# Patient Record
Sex: Female | Born: 1955 | Race: Black or African American | Hispanic: No | Marital: Single | Smoking: Former smoker
Health system: Southern US, Community
[De-identification: ages and names within clinical notes are randomized; demographics above are authoritative.]

## PROBLEM LIST (undated history)

## (undated) ENCOUNTER — Emergency Department: Discharge status: Absent without leave | Payer: Medicare Other

## (undated) DIAGNOSIS — Z972 Presence of dental prosthetic device (complete) (partial): Secondary | ICD-10-CM

## (undated) DIAGNOSIS — F50819 Binge eating disorder, unspecified: Secondary | ICD-10-CM

## (undated) DIAGNOSIS — F909 Attention-deficit hyperactivity disorder, unspecified type: Secondary | ICD-10-CM

## (undated) DIAGNOSIS — M1990 Unspecified osteoarthritis, unspecified site: Secondary | ICD-10-CM

## (undated) DIAGNOSIS — G894 Chronic pain syndrome: Secondary | ICD-10-CM

## (undated) DIAGNOSIS — F4310 Post-traumatic stress disorder, unspecified: Secondary | ICD-10-CM

## (undated) DIAGNOSIS — F5081 Binge eating disorder: Secondary | ICD-10-CM

## (undated) DIAGNOSIS — K219 Gastro-esophageal reflux disease without esophagitis: Secondary | ICD-10-CM

## (undated) DIAGNOSIS — F419 Anxiety disorder, unspecified: Secondary | ICD-10-CM

## (undated) DIAGNOSIS — R0600 Dyspnea, unspecified: Secondary | ICD-10-CM

## (undated) DIAGNOSIS — F329 Major depressive disorder, single episode, unspecified: Secondary | ICD-10-CM

## (undated) DIAGNOSIS — F32A Depression, unspecified: Secondary | ICD-10-CM

## (undated) DIAGNOSIS — S3992XA Unspecified injury of lower back, initial encounter: Secondary | ICD-10-CM

## (undated) DIAGNOSIS — E119 Type 2 diabetes mellitus without complications: Secondary | ICD-10-CM

## (undated) DIAGNOSIS — T753XXA Motion sickness, initial encounter: Secondary | ICD-10-CM

## (undated) DIAGNOSIS — J45909 Unspecified asthma, uncomplicated: Secondary | ICD-10-CM

## (undated) DIAGNOSIS — I10 Essential (primary) hypertension: Secondary | ICD-10-CM

## (undated) HISTORY — DX: Binge eating disorder, unspecified: F50.819

## (undated) HISTORY — DX: Depression, unspecified: F32.A

## (undated) HISTORY — DX: Attention-deficit hyperactivity disorder, unspecified type: F90.9

## (undated) HISTORY — DX: Chronic pain syndrome: G89.4

## (undated) HISTORY — PX: CERVICAL FUSION: SHX112

## (undated) HISTORY — DX: Binge eating disorder: F50.81

## (undated) HISTORY — DX: Major depressive disorder, single episode, unspecified: F32.9

## (undated) HISTORY — DX: Post-traumatic stress disorder, unspecified: F43.10

## (undated) HISTORY — PX: BACK SURGERY: SHX140

## (undated) HISTORY — DX: Unspecified asthma, uncomplicated: J45.909

## (undated) HISTORY — DX: Anxiety disorder, unspecified: F41.9

## (undated) HISTORY — DX: Unspecified injury of lower back, initial encounter: S39.92XA

---

## 2004-06-26 ENCOUNTER — Emergency Department: Payer: Self-pay | Admitting: Emergency Medicine

## 2007-05-28 ENCOUNTER — Ambulatory Visit: Payer: Self-pay | Admitting: Family Medicine

## 2008-03-06 DIAGNOSIS — S3992XA Unspecified injury of lower back, initial encounter: Secondary | ICD-10-CM

## 2008-03-06 HISTORY — DX: Unspecified injury of lower back, initial encounter: S39.92XA

## 2008-03-28 ENCOUNTER — Emergency Department: Payer: Self-pay | Admitting: Emergency Medicine

## 2009-03-30 ENCOUNTER — Emergency Department: Payer: Self-pay | Admitting: Emergency Medicine

## 2010-09-14 ENCOUNTER — Emergency Department: Payer: Self-pay | Admitting: Emergency Medicine

## 2010-11-10 ENCOUNTER — Emergency Department: Payer: Self-pay | Admitting: Emergency Medicine

## 2011-09-19 ENCOUNTER — Ambulatory Visit: Payer: Self-pay | Admitting: Internal Medicine

## 2012-02-09 ENCOUNTER — Ambulatory Visit: Payer: Self-pay | Admitting: Emergency Medicine

## 2012-04-23 ENCOUNTER — Ambulatory Visit: Payer: Self-pay | Admitting: Family Medicine

## 2013-11-28 ENCOUNTER — Ambulatory Visit: Payer: Self-pay

## 2015-05-08 ENCOUNTER — Encounter: Payer: Self-pay | Admitting: Emergency Medicine

## 2015-05-08 ENCOUNTER — Emergency Department
Admission: EM | Admit: 2015-05-08 | Discharge: 2015-05-08 | Disposition: A | Discharge status: Home / Self Care | Payer: Self-pay | Attending: Student | Admitting: Student

## 2015-05-08 DIAGNOSIS — I10 Essential (primary) hypertension: Secondary | ICD-10-CM | POA: Insufficient documentation

## 2015-05-08 DIAGNOSIS — E119 Type 2 diabetes mellitus without complications: Secondary | ICD-10-CM | POA: Insufficient documentation

## 2015-05-08 DIAGNOSIS — H9202 Otalgia, left ear: Secondary | ICD-10-CM | POA: Insufficient documentation

## 2015-05-08 DIAGNOSIS — Z87891 Personal history of nicotine dependence: Secondary | ICD-10-CM | POA: Insufficient documentation

## 2015-05-08 HISTORY — DX: Essential (primary) hypertension: I10

## 2015-05-08 HISTORY — DX: Type 2 diabetes mellitus without complications: E11.9

## 2015-05-08 MED ORDER — OFLOXACIN 0.3 % OP SOLN: 1.0000 [drp] | Freq: Four times a day (QID) | OPHTHALMIC | Status: DC

## 2015-05-08 NOTE — ED Notes (Signed)
Discussed discharge instructions, prescriptions, and follow-up care with patient. No questions or concerns at this time. Pt stable at discharge.  

## 2015-05-08 NOTE — ED Notes (Signed)
Intermittent ear discomfort x 2 weeks. States heard fluttering at onset and thinks might have gotten something in it.

## 2015-05-08 NOTE — Discharge Instructions (Signed)
Ear Drops, Adult °You have been diagnosed with a condition requiring you to put drops of medicine into your outer ear. °HOME CARE INSTRUCTIONS  °· Put drops in the affected ear as instructed. After putting the drops in, you will need to lie down with the affected ear facing up for ten minutes so the drops will remain in the ear canal and run down and fill the canal. Continue using the ear drops for as long as directed by your health care provider. °· Prior to getting up, put a cotton ball gently in your ear canal. Leave enough of the cotton ball out so it can be easily removed. Do not attempt to push this down into the canal with a cotton-tipped swab or other instrument. °· Do not irrigate or wash out your ears if you have had a perforated eardrum or mastoid surgery, or unless instructed to do so by your health care provider. °· Keep appointments with your health care provider as instructed. °· Finish all medicine, or use for the length of time prescribed by your health care provider. Continue the drops even if your problem seems to be doing well after a couple days, or continue as instructed. °SEEK MEDICAL CARE IF: °· You become worse or develop increasing pain. °· You notice any unusual drainage from your ear (particularly if the drainage has a bad smell). °· You develop hearing difficulties. °· You experience a serious form of dizziness in which you feel as if the room is spinning, and you feel nauseated (vertigo). °· The outside of your ear becomes red or swollen or both. This may be a sign of an allergic reaction. °MAKE SURE YOU:  °· Understand these instructions. °· Will watch your condition. °· Will get help right away if you are not doing well or get worse. °  °This information is not intended to replace advice given to you by your health care provider. Make sure you discuss any questions you have with your health care provider. °  °Document Released: 02/14/2001 Document Revised: 03/13/2014 Document  Reviewed: 09/17/2012 °Elsevier Interactive Patient Education ©2016 [employer] ° °Earache °An earache, also called otalgia, can be caused by many things. Pain from an earache can be sharp, dull, or burning. The pain may be temporary or constant. °Earaches can be caused by problems with the ear, such as infection in either the middle ear or the ear canal, injury, impacted ear wax, middle ear pressure, or a foreign body in the ear. Ear pain can also result from problems in other areas. This is called referred pain. For example, pain can come from a sore throat, a tooth infection, or problems with the jaw or the joint between the jaw and the skull (temporomandibular joint, or TMJ). °The cause of an earache is not always easy to identify. Watchful waiting may be appropriate for some earaches until a clear cause of the pain can be found. °HOME CARE INSTRUCTIONS °Watch your condition for any changes. The following actions may help to lessen any discomfort that you are feeling: °· Take medicines only as directed by your health care provider. This includes ear drops. °· Apply ice to your outer ear to help reduce pain. °¨ Put ice in a plastic bag. °¨ Place a towel between your skin and the bag. °¨ Leave the ice on for 20 minutes, 2-3 times per day. °· Do not put anything in your ear other than medicine that is prescribed by your health care provider. °· Try resting   in an upright position instead of lying down. This may help to reduce pressure in the middle ear and relieve pain. °· Chew gum if it helps to relieve your ear pain. °· Control any allergies that you have. °· Keep all follow-up visits as directed by your health care provider. This is important. °SEEK MEDICAL CARE IF: °· Your pain does not improve within 2 days. °· You have a fever. °· You have new or worsening symptoms. °SEEK IMMEDIATE MEDICAL CARE IF: °· You have a severe headache. °· You have a stiff neck. °· You have difficulty swallowing. °· You have redness  or swelling behind your ear. °· You have drainage from your ear. °· You have hearing loss. °· You feel dizzy. °  °This information is not intended to replace advice given to you by your health care provider. Make sure you discuss any questions you have with your health care provider. °  °Document Released: 10/08/2003 Document Revised: 03/13/2014 Document Reviewed: 09/21/2013 °Elsevier Interactive Patient Education ©2016 [employer] ° °

## 2015-05-08 NOTE — ED Provider Notes (Signed)
[facility] Emergency Department Provider Note  ____________________________________________  Time seen: Approximately 8:08 AM  I have reviewed the triage vital signs and the nursing notes.   HISTORY  Chief Complaint Otalgia    HPI Maria Conway is a 60 y.o. female presents for evaluation of intermittent ear discomfort for 2 weeks on the left ear. Patient states that she feels like something may have gotten it is not sure. Has been using Q-tips and peroxide with no relief. Eyes any fever chills vertical or off balance. Past medical history significant for hypertension and diabetes and blood pressure was noted and discussed. Patient rates her pain presently is an 8/10. Denies any drainage or discharge.   Past Medical History  Diagnosis Date  . Diabetes mellitus without complication (HCC)   . Hypertension     There are no active problems to display for this patient.   No past surgical history on file.  No current outpatient prescriptions on file.  Allergies Lisinopril  No family history on file.  Social History Social History  Substance Use Topics  . Smoking status: Former Games developer  . Smokeless tobacco: None  . Alcohol Use: No    Review of Systems Constitutional: No fever/chills no dizziness ENT: No sore throat. Positive for left ear discomfort Cardiovascular: Denies chest pain. Respiratory: Denies shortness of breath. Musculoskeletal: Negative for back pain. Skin: Negative for rash. Neurological: Negative for headaches, focal weakness or numbness.  10-point ROS otherwise negative.  ____________________________________________   PHYSICAL EXAM:  VITAL SIGNS: ED Triage Vitals  Enc Vitals Group     BP 05/08/15 0753 166/100 mmHg     Pulse Rate 05/08/15 0753 98     Resp 05/08/15 0753 20     Temp 05/08/15 0753 98.1 F (36.7 C)     Temp Source 05/08/15 0753 Oral     SpO2 05/08/15 0753 98 %     Weight 05/08/15 0753 250 lb (113.399  kg)     Height 05/08/15 0753  (1.676 m)     Head Cir --      Peak Flow --      Pain Score 05/08/15 0757 8     Pain Loc --      Pain Edu? --      Excl. in GC? --     Constitutional: Alert and oriented. Well appearing and in no acute distress. Head: Atraumatic. Right TM unremarkable left TM with some dulling of the membrane and acute redness and erythema to the canal itself. No perforations or foreign body noted Nose: No congestion/rhinnorhea. Mouth/Throat: Mucous membranes are moist.  Oropharynx non-erythematous. Neck: No stridor.   Cardiovascular: Normal rate, regular rhythm. Grossly normal heart sounds.  Good peripheral circulation. Respiratory: Normal respiratory effort.  No retractions. Lungs CTAB. Neurologic:  Normal speech and language. No gross focal neurologic deficits are appreciated. No gait instability. Skin:  Skin is warm, dry and intact. No rash noted. Psychiatric: Mood and affect are normal. Speech and behavior are normal.  ____________________________________________   LABS (all labs ordered are listed, but only abnormal results are displayed)  Labs Reviewed - No data to display    PROCEDURES  Procedure(s) performed: None  Critical Care performed: No  ____________________________________________   INITIAL IMPRESSION / ASSESSMENT AND PLAN / ED COURSE  Pertinent labs & imaging results that were available during my care of the patient were reviewed by me and considered in my medical decision making (see chart for details).  Acute otitis externa. Rx  given for Ocuflox drops twice a day to left ear. Patient follow-up with her PCP which she has scheduled next week for recheck and monitoring of her blood pressure. He voices no other complaints at this time. ____________________________________________   FINAL CLINICAL IMPRESSION(S) / ED DIAGNOSES  Final diagnoses:  None     This chart was dictated using voice recognition software/Dragon. Despite best  efforts to proofread, errors can occur which can change the meaning. Any change was purely unintentional.   Evangeline Dakinharles M Issacc Merlo, PA-C 05/08/15 [phone]  Gayla DossEryka A Gayle, MD 05/08/15 [phone]

## 2015-05-14 ENCOUNTER — Ambulatory Visit: Payer: Self-pay

## 2015-09-16 ENCOUNTER — Encounter: Payer: Self-pay | Admitting: Pharmacist

## 2016-02-09 DIAGNOSIS — R1900 Intra-abdominal and pelvic swelling, mass and lump, unspecified site: Secondary | ICD-10-CM | POA: Insufficient documentation

## 2016-02-25 DIAGNOSIS — R928 Other abnormal and inconclusive findings on diagnostic imaging of breast: Secondary | ICD-10-CM | POA: Insufficient documentation

## 2016-06-20 ENCOUNTER — Emergency Department: Payer: Medicaid Other

## 2016-06-20 ENCOUNTER — Inpatient Hospital Stay
Admission: EM | Admit: 2016-06-20 | Discharge: 2016-06-23 | DRG: 446 | Disposition: A | Discharge status: Home / Self Care | Payer: Medicaid Other | Attending: Surgery | Admitting: Surgery

## 2016-06-20 ENCOUNTER — Encounter: Payer: Self-pay | Admitting: *Deleted

## 2016-06-20 DIAGNOSIS — Z7951 Long term (current) use of inhaled steroids: Secondary | ICD-10-CM

## 2016-06-20 DIAGNOSIS — Z6839 Body mass index (BMI) 39.0-39.9, adult: Secondary | ICD-10-CM

## 2016-06-20 DIAGNOSIS — E1143 Type 2 diabetes mellitus with diabetic autonomic (poly)neuropathy: Secondary | ICD-10-CM | POA: Diagnosis present

## 2016-06-20 DIAGNOSIS — I10 Essential (primary) hypertension: Secondary | ICD-10-CM | POA: Diagnosis present

## 2016-06-20 DIAGNOSIS — K5900 Constipation, unspecified: Secondary | ICD-10-CM | POA: Diagnosis present

## 2016-06-20 DIAGNOSIS — R112 Nausea with vomiting, unspecified: Secondary | ICD-10-CM

## 2016-06-20 DIAGNOSIS — R1011 Right upper quadrant pain: Secondary | ICD-10-CM

## 2016-06-20 DIAGNOSIS — Z79899 Other long term (current) drug therapy: Secondary | ICD-10-CM

## 2016-06-20 DIAGNOSIS — M549 Dorsalgia, unspecified: Secondary | ICD-10-CM | POA: Diagnosis present

## 2016-06-20 DIAGNOSIS — Z87891 Personal history of nicotine dependence: Secondary | ICD-10-CM

## 2016-06-20 DIAGNOSIS — K3184 Gastroparesis: Secondary | ICD-10-CM | POA: Diagnosis present

## 2016-06-20 DIAGNOSIS — Z794 Long term (current) use of insulin: Secondary | ICD-10-CM | POA: Diagnosis not present

## 2016-06-20 DIAGNOSIS — Z888 Allergy status to other drugs, medicaments and biological substances status: Secondary | ICD-10-CM

## 2016-06-20 DIAGNOSIS — R1084 Generalized abdominal pain: Secondary | ICD-10-CM

## 2016-06-20 DIAGNOSIS — K8000 Calculus of gallbladder with acute cholecystitis without obstruction: Secondary | ICD-10-CM | POA: Diagnosis not present

## 2016-06-20 DIAGNOSIS — G8929 Other chronic pain: Secondary | ICD-10-CM | POA: Diagnosis present

## 2016-06-20 DIAGNOSIS — R5381 Other malaise: Secondary | ICD-10-CM | POA: Diagnosis present

## 2016-06-20 DIAGNOSIS — B373 Candidiasis of vulva and vagina: Secondary | ICD-10-CM | POA: Diagnosis present

## 2016-06-20 DIAGNOSIS — K819 Cholecystitis, unspecified: Secondary | ICD-10-CM | POA: Diagnosis present

## 2016-06-20 DIAGNOSIS — K810 Acute cholecystitis: Secondary | ICD-10-CM

## 2016-06-20 DIAGNOSIS — E669 Obesity, unspecified: Secondary | ICD-10-CM | POA: Diagnosis present

## 2016-06-20 DIAGNOSIS — J45909 Unspecified asthma, uncomplicated: Secondary | ICD-10-CM | POA: Diagnosis present

## 2016-06-20 LAB — GLUCOSE, CAPILLARY
GLUCOSE-CAPILLARY: 173 mg/dL — AB (ref 65–99)
Glucose-Capillary: 124 mg/dL — ABNORMAL HIGH (ref 65–99)
Glucose-Capillary: 185 mg/dL — ABNORMAL HIGH (ref 65–99)

## 2016-06-20 LAB — HEPATIC FUNCTION PANEL
ALK PHOS: 103 U/L (ref 38–126)
ALT: 20 U/L (ref 14–54)
AST: 22 U/L (ref 15–41)
Albumin: 3.8 g/dL (ref 3.5–5.0)
BILIRUBIN TOTAL: 0.6 mg/dL (ref 0.3–1.2)
Total Protein: 7.4 g/dL (ref 6.5–8.1)

## 2016-06-20 LAB — LIPASE, BLOOD: LIPASE: 46 U/L (ref 11–51)

## 2016-06-20 LAB — BASIC METABOLIC PANEL
ANION GAP: 9 (ref 5–15)
BUN: 10 mg/dL (ref 6–20)
CO2: 27 mmol/L (ref 22–32)
CREATININE: 1.02 mg/dL — AB (ref 0.44–1.00)
Calcium: 9.7 mg/dL (ref 8.9–10.3)
Chloride: 100 mmol/L — ABNORMAL LOW (ref 101–111)
GFR calc Af Amer: >60 mL/min (ref >60)
GFR, EST NON AFRICAN AMERICAN: 59 mL/min — AB (ref >60)
GLUCOSE: 129 mg/dL — AB (ref 65–99)
Potassium: 3.5 mmol/L (ref 3.5–5.1)
Sodium: 136 mmol/L (ref 135–145)

## 2016-06-20 LAB — TROPONIN I: Troponin I: <0.03 ng/mL (ref <0.03)

## 2016-06-20 LAB — CBC
HCT: 37.6 % (ref 35.0–47.0)
Hemoglobin: 12.7 g/dL (ref 12.0–16.0)
MCH: 27.1 pg (ref 26.0–34.0)
MCHC: 33.8 g/dL (ref 32.0–36.0)
MCV: 80.1 fL (ref 80.0–100.0)
PLATELETS: 255 10*3/uL (ref 150–440)
RBC: 4.69 MIL/uL (ref 3.80–5.20)
RDW: 15.1 % — AB (ref 11.5–14.5)
WBC: 9.9 10*3/uL (ref 3.6–11.0)

## 2016-06-20 MED ORDER — KETOROLAC TROMETHAMINE 30 MG/ML IJ SOLN
30.0000 mg | Freq: Four times a day (QID) | INTRAMUSCULAR | Status: DC
  Administered 2016-06-20 – 2016-06-23 (×13): 30 mg via INTRAVENOUS
  Filled 2016-06-20 (×13): qty 1

## 2016-06-20 MED ORDER — HALOPERIDOL LACTATE 5 MG/ML IJ SOLN: 2.0000 mg | Freq: Once | INTRAMUSCULAR | Status: DC

## 2016-06-20 MED ORDER — PANTOPRAZOLE SODIUM 40 MG IV SOLR
40.0000 mg | Freq: Every day | INTRAVENOUS | Status: DC
  Administered 2016-06-20 – 2016-06-22 (×3): 40 mg via INTRAVENOUS
  Filled 2016-06-20 (×3): qty 40

## 2016-06-20 MED ORDER — LACTATED RINGERS IV SOLN
125.0000 mL/h | INTRAVENOUS | Status: DC
  Administered 2016-06-20 (×2): 125 mL/h via INTRAVENOUS

## 2016-06-20 MED ORDER — HYDROMORPHONE HCL 1 MG/ML IJ SOLN
0.5000 mg | INTRAMUSCULAR | Status: DC | PRN
  Administered 2016-06-20: 0.5 mg via INTRAVENOUS
  Filled 2016-06-20: qty 1

## 2016-06-20 MED ORDER — IOPAMIDOL (ISOVUE-300) INJECTION 61%
30.0000 mL | Freq: Once | INTRAVENOUS | Status: AC | PRN
  Administered 2016-06-20: 30 mL via ORAL

## 2016-06-20 MED ORDER — ALBUTEROL SULFATE (2.5 MG/3ML) 0.083% IN NEBU: 2.5000 mg | INHALATION_SOLUTION | RESPIRATORY_TRACT | Status: DC | PRN

## 2016-06-20 MED ORDER — AMLODIPINE BESYLATE 5 MG PO TABS
10.0000 mg | ORAL_TABLET | Freq: Every day | ORAL | Status: DC
  Administered 2016-06-20 – 2016-06-23 (×4): 10 mg via ORAL
  Filled 2016-06-20 (×5): qty 2

## 2016-06-20 MED ORDER — ONDANSETRON HCL 4 MG/2ML IJ SOLN
4.0000 mg | Freq: Four times a day (QID) | INTRAMUSCULAR | Status: DC | PRN
  Administered 2016-06-21 – 2016-06-23 (×2): 4 mg via INTRAVENOUS
  Filled 2016-06-20 (×2): qty 2

## 2016-06-20 MED ORDER — PIPERACILLIN-TAZOBACTAM 3.375 G IVPB 30 MIN
3.3750 g | Freq: Once | INTRAVENOUS | Status: AC
  Administered 2016-06-20: 3.375 g via INTRAVENOUS
  Filled 2016-06-20: qty 50

## 2016-06-20 MED ORDER — ONDANSETRON 4 MG PO TBDP: 4.0000 mg | ORAL_TABLET | Freq: Four times a day (QID) | ORAL | Status: DC | PRN

## 2016-06-20 MED ORDER — ENOXAPARIN SODIUM 40 MG/0.4ML ~~LOC~~ SOLN
40.0000 mg | SUBCUTANEOUS | Status: DC
  Administered 2016-06-20 – 2016-06-22 (×3): 40 mg via SUBCUTANEOUS
  Filled 2016-06-20 (×3): qty 0.4

## 2016-06-20 MED ORDER — HYDROCHLOROTHIAZIDE 25 MG PO TABS
25.0000 mg | ORAL_TABLET | Freq: Every day | ORAL | Status: DC
  Administered 2016-06-20 – 2016-06-23 (×4): 25 mg via ORAL
  Filled 2016-06-20 (×4): qty 1

## 2016-06-20 MED ORDER — INSULIN ASPART 100 UNIT/ML ~~LOC~~ SOLN
0.0000 [IU] | SUBCUTANEOUS | Status: DC
  Administered 2016-06-20 (×2): 4 [IU] via SUBCUTANEOUS
  Administered 2016-06-21 (×2): 3 [IU] via SUBCUTANEOUS
  Administered 2016-06-22 (×2): 11 [IU] via SUBCUTANEOUS
  Filled 2016-06-20 (×2): qty 3
  Filled 2016-06-20 (×2): qty 11
  Filled 2016-06-20 (×2): qty 4

## 2016-06-20 MED ORDER — PROMETHAZINE HCL 25 MG/ML IJ SOLN
12.5000 mg | Freq: Four times a day (QID) | INTRAMUSCULAR | Status: DC | PRN
  Administered 2016-06-20: 12.5 mg via INTRAVENOUS
  Filled 2016-06-20: qty 1

## 2016-06-20 MED ORDER — BUDESONIDE 0.25 MG/2ML IN SUSP
0.2500 mg | Freq: Two times a day (BID) | RESPIRATORY_TRACT | Status: DC
  Administered 2016-06-20 – 2016-06-23 (×6): 0.25 mg via RESPIRATORY_TRACT
  Filled 2016-06-20 (×6): qty 2

## 2016-06-20 MED ORDER — PIPERACILLIN-TAZOBACTAM 3.375 G IVPB
3.3750 g | Freq: Three times a day (TID) | INTRAVENOUS | Status: DC
  Administered 2016-06-20 – 2016-06-22 (×6): 3.375 g via INTRAVENOUS
  Filled 2016-06-20 (×8): qty 50

## 2016-06-20 MED ORDER — IOPAMIDOL (ISOVUE-300) INJECTION 61%
100.0000 mL | Freq: Once | INTRAVENOUS | Status: AC | PRN
  Administered 2016-06-20: 100 mL via INTRAVENOUS

## 2016-06-20 MED ORDER — BECLOMETHASONE DIPROPIONATE 40 MCG/ACT IN AERS: 2.0000 | INHALATION_SPRAY | Freq: Two times a day (BID) | RESPIRATORY_TRACT | Status: DC

## 2016-06-20 MED ORDER — SODIUM CHLORIDE 0.9 % IV BOLUS (SEPSIS)
1000.0000 mL | Freq: Once | INTRAVENOUS | Status: AC
  Administered 2016-06-20: 1000 mL via INTRAVENOUS

## 2016-06-20 MED ORDER — MORPHINE SULFATE (PF) 4 MG/ML IV SOLN
4.0000 mg | INTRAVENOUS | Status: DC | PRN
  Administered 2016-06-20 – 2016-06-23 (×10): 4 mg via INTRAVENOUS
  Filled 2016-06-20 (×10): qty 1

## 2016-06-20 MED ORDER — LORAZEPAM 2 MG/ML IJ SOLN
1.0000 mg | Freq: Once | INTRAMUSCULAR | Status: AC
  Administered 2016-06-20: 1 mg via INTRAVENOUS
  Filled 2016-06-20: qty 1

## 2016-06-20 MED ORDER — GI COCKTAIL ~~LOC~~
30.0000 mL | Freq: Once | ORAL | Status: AC
  Administered 2016-06-20: 30 mL via ORAL
  Filled 2016-06-20: qty 30

## 2016-06-20 MED ORDER — ALBUTEROL SULFATE HFA 108 (90 BASE) MCG/ACT IN AERS: 2.0000 | INHALATION_SPRAY | RESPIRATORY_TRACT | Status: DC | PRN

## 2016-06-20 NOTE — Progress Notes (Signed)
Pharmacy Antibiotic Note  Maria Conway is a 61 y.o. female admitted on 06/20/2016 with intra-abdominal infection.  Pharmacy has been consulted for piperacillin/tazobactam dosing.  Plan: Piperacillin/tazobactam 3.375 g IV q8h EI  Height:  (167.6 cm) Weight: 247 lb (112 kg) IBW/kg (Calculated) : 59.3  Temp (24hrs), Avg:98 F (36.7 C), Min:98 F (36.7 C), Max:98 F (36.7 C)   Recent Labs Lab 06/20/16 0052  WBC 9.9  CREATININE 1.02*    Estimated Creatinine Clearance: 74.4 mL/min (A) (by C-G formula based on SCr of 1.02 mg/dL (H)).    Allergies  Allergen Reactions  . Aspartame And Phenylalanine Other (See Comments) and Cough    Reaction: throat irritation  . Lisinopril Cough   Thank you for allowing pharmacy to be a part of this patient's care.  Cindi Carbon, PharmD Clinical Pharmacist 06/20/2016 1:38 PM

## 2016-06-20 NOTE — ED Notes (Signed)
Up to toilet to void. 

## 2016-06-20 NOTE — ED Provider Notes (Signed)
Patient received in sign-out from Dr. Zenda Alpers.  Workup and evaluation pending CT imaging. CT imaging does show a distended gallbladder with concerning for possible cholecystitis. Will further characterize with right upper quadrant ultrasound. Patient otherwise is afebrile and hemodynamically stable.   ----------------------------------------- 11:18 AM on 06/20/2016 -----------------------------------------  Ultrasound does show evidence of cholelithiasis with evidence of acute cholecystitis. Patient remains hemodynamically stable but does have persistent pain in the right upper quadrant. I did speak with Dr. Sabino Niemann regarding the patient's findings and he agrees to consult the patient.  Have discussed with the patient and available family all diagnostics and treatments performed thus far and all questions were answered to the best of my ability. The patient demonstrates understanding and agreement with plan.    Willy Eddy, MD 06/20/16 1118

## 2016-06-20 NOTE — ED Notes (Signed)
Pt oxygen sat 88% RA.  Patient encouraged to take deep breaths.  Patient placed on 2L [place] at this time.

## 2016-06-20 NOTE — ED Notes (Signed)
Awaiting US results.
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
## 2016-06-20 NOTE — ED Provider Notes (Signed)
[facility] Emergency Department Provider Note   ____________________________________________   First MD Initiated Contact with Patient 06/20/16 [phone]     (approximate)  I have reviewed the triage vital signs and the nursing notes.   HISTORY  Chief Complaint Abdominal Pain; Chest Pain; and Constipation    HPI Maria Conway is a 61 y.o. female who comes into the hospital today with severe abdominal pain and bloating. She reports that her stomach is rising and it seems to block her breathing so that she cannot breathe. She reports that she cannot go to the bathroom and passed gas. She has a history of chronic pain due to back pain but this belly pain has been going on for about 3 days. The patient states that she has been vomiting and it looks like foam. She states that the pain is all over her abdomen but mostly on the right side. She rates her pain a 10 out of 10 in intensity. She's had some chills with no fever and has been urinating well. The patient has had similar symptoms to this in the past and has been told she has gastroparesis. A month ago she was in [place] with similar symptoms but she reports it was more severe then. The patient reports that she is unable to tolerate the pain so she decided to come into the hospital today for further evaluation of the symptoms.   Past Medical History:  Diagnosis Date  . Diabetes mellitus without complication (HCC)   . Hypertension     There are no active problems to display for this patient.   History reviewed. No pertinent surgical history.  Prior to Admission medications   Medication Sig Start Date End Date Taking? Authorizing Provider  ofloxacin (OCUFLOX) 0.3 % ophthalmic solution Place 1 drop into the left ear 4 (four) times daily. 05/08/15   Evangeline Dakin, PA-C    Allergies Lisinopril  No family history on file.  Social History Social History  Substance Use Topics  . Smoking status: Former  Games developer  . Smokeless tobacco: Not on file  . Alcohol use No    Review of Systems Constitutional: No fever/chills Eyes: No visual changes. ENT: No sore throat. Cardiovascular: Denies chest pain. Respiratory: Denies shortness of breath. Gastrointestinal:  abdominal pain, nausea, vomiting.  No diarrhea.  No constipation. Genitourinary: Negative for dysuria. Musculoskeletal: Negative for back pain. Skin: Negative for rash. Neurological: Negative for headaches, focal weakness or numbness.  10-point ROS otherwise negative.  ____________________________________________   PHYSICAL EXAM:  VITAL SIGNS: ED Triage Vitals  Enc Vitals Group     BP 06/20/16 0054 (!) 146/90     Pulse Rate 06/20/16 0054 89     Resp 06/20/16 0054 20     Temp 06/20/16 0054 98 F (36.7 C)     Temp Source 06/20/16 0054 Oral     SpO2 06/20/16 0054 96 %     Weight 06/20/16 0048 247 lb (112 kg)     Height 06/20/16 0048  (1.676 m)     Head Circumference --      Peak Flow --      Pain Score 06/20/16 0048 10     Pain Loc --      Pain Edu? --      Excl. in GC? --     Constitutional: Alert and oriented. Well appearing and in moderate to severe distress. Eyes: Conjunctivae are normal. PERRL. EOMI. Head: Atraumatic. Nose: No congestion/rhinnorhea. Mouth/Throat: Mucous membranes  are moist.  Oropharynx non-erythematous. Cardiovascular: Normal rate, regular rhythm. Grossly normal heart sounds.  Good peripheral circulation. Respiratory: Normal respiratory effort.  No retractions. Lungs CTAB. Gastrointestinal: Soft with diffuse abd tenderness to palpation. No distention. Positive bowel sounds Musculoskeletal: No lower extremity tenderness nor edema.   Neurologic:  Normal speech and language.  Skin:  Skin is warm, dry and intact.  Psychiatric: Mood and affect are normal.   ____________________________________________   LABS (all labs ordered are listed, but only abnormal results are displayed)  Labs  Reviewed  BASIC METABOLIC PANEL - Abnormal; Notable for the following:       Result Value   Chloride 100 (*)    Glucose, Bld 129 (*)    Creatinine, Ser 1.02 (*)    GFR calc non Af Amer 59 (*)    All other components within normal limits  CBC - Abnormal; Notable for the following:    RDW 15.1 (*)    All other components within normal limits  TROPONIN I  LIPASE, BLOOD  TROPONIN I   ____________________________________________  EKG  ED ECG REPORT I, Rebecka Apley, the attending physician, personally viewed and interpreted this ECG.   Date: 06/20/2016  EKG Time: 0049  Rate: 83  Rhythm: normal sinus rhythm  Axis: normal  Intervals:none  ST&T Change: none  ____________________________________________  RADIOLOGY  KUB CXR CT abd and pelvis  ____________________________________________   PROCEDURES  Procedure(s) performed: None  Procedures  Critical Care performed: No  ____________________________________________   INITIAL IMPRESSION / ASSESSMENT AND PLAN / ED COURSE  Pertinent labs & imaging results that were available during my care of the patient were reviewed by me and considered in my medical decision making (see chart for details).  This is a 61 year old female who comes into the hospital today with some abdominal pain. She is also had some nausea and vomiting. The patient reports that she has been diagnosed with gastroparesis in the past but she states that her abdomen is distended. I did check the patient's blood work including 2 troponins which were unremarkable. The patient's x-rays are unremarkable but given her level of pain I will perform a CT scan. I will give the patient liter of normal saline and a GI cocktail with some Ativan. I will reassess the patient.  Clinical Course as of Jun 20 801  Tue Jun 20, 2016  0446 Normal nonobstructive bowel gas pattern. DG Abdomen 1 View [AW]  0446 No evidence of active pulmonary disease. Linear fibrosis  or atelectasis in the left lung base.   DG Chest 2 View [AW]    Clinical Course User Index [AW] Rebecka Apley, MD    The patient's care will be signed out to Dr. Roxan Hockey who will follow-up the results of the CT scan and disposition the patient. ____________________________________________   FINAL CLINICAL IMPRESSION(S) / ED DIAGNOSES  Final diagnoses:  Generalized abdominal pain  Non-intractable vomiting with nausea, unspecified vomiting type      NEW MEDICATIONS STARTED DURING THIS VISIT:  New Prescriptions   No medications on file     Note:  This document was prepared using Dragon voice recognition software and may include unintentional dictation errors.    Rebecka Apley, MD 06/20/16 [phone]

## 2016-06-20 NOTE — H&P (Signed)
Date of Admission:  06/20/2016  Reason for Admission:  Acute cholecystitis  History of Present Illness: Maria Conway is a 61 y.o. female who presents with a 5 day history of abdominal pain.  She reports an episode of abdominal pain about a month ago in [place] and was hospitalized for a few days, and she was told she had gastroparesis.  Per her report, there was no mention of gallbladder issues.    This episode started on 4/13 with some vague abdominal pain in the upper abdomen.  The patient was unsure what it was and had tried home remedies over the weekend with only temporary improvement in her symptoms.  However, on 4/15, her pain started worsening and has become severe causing nausea and vomiting.  She feels bloated as well.  Her pain is mostly in the epigastric and right upper quadrant.  Denies any fevers at home, chills, chest pain, shortness of breath.  Does report that with the pain it hurts to take a deep breath.  Denies any constipation or diarrhea, dysuria or hematuria.  Past Medical History: Past Medical History:  Diagnosis Date  . Diabetes mellitus without complication (HCC)   . Hypertension Chronic back pain Asthma Gastroparesis      Past Surgical History: --C section  Home Medications: Prior to Admission medications   Medication Sig Start Date End Date Taking? Authorizing Provider  albuterol (PROVENTIL HFA;VENTOLIN HFA) 108 (90 Base) MCG/ACT inhaler Inhale 2 puffs into the lungs every 4 (four) hours as needed for wheezing or shortness of breath.   Yes Historical Provider, MD  amLODipine (NORVASC) 10 MG tablet Take 10 mg by mouth daily.   Yes Historical Provider, MD  beclomethasone (QVAR) 40 MCG/ACT inhaler Inhale 2 puffs into the lungs 2 (two) times daily.   Yes Historical Provider, MD  exenatide (BYETTA) 5 MCG/0.02ML SOPN injection Inject 5 mcg into the skin 2 (two) times daily.   Yes Historical Provider, MD  gabapentin (NEURONTIN) 600 MG tablet Take 600 mg by mouth 4  (four) times daily.   Yes Historical Provider, MD  hydrochlorothiazide (HYDRODIURIL) 25 MG tablet Take 25 mg by mouth daily.   Yes Historical Provider, MD  insulin regular (NOVOLIN R,HUMULIN R) 250 units/2.80mL (100 units/mL) injection Inject 2-10 Units into the skin 3 (three) times daily before meals. Per sliding scale   Yes Historical Provider, MD  lisdexamfetamine (VYVANSE) 30 MG capsule Take 30 mg by mouth daily.   Yes Historical Provider, MD  metFORMIN (GLUCOPHAGE-XR) 500 MG 24 hr tablet Take 1,000 mg by mouth 2 (two) times daily.   Yes Historical Provider, MD    Allergies: Allergies  Allergen Reactions  . Aspartame And Phenylalanine Other (See Comments) and Cough    Reaction: throat irritation  . Lisinopril Cough    Social History:  reports that she has quit smoking. She does not have any smokeless tobacco history on file. She reports that she does not drink alcohol. Her drug history is not on file.   Family History: Diabetes in the family  Review of Systems: Review of Systems  Constitutional: Negative for chills and fever.  HENT: Negative for hearing loss.   Eyes: Negative for blurred vision.  Respiratory: Negative for cough and shortness of breath.   Cardiovascular: Negative for chest pain and leg swelling.  Gastrointestinal: Positive for abdominal pain, nausea and vomiting. Negative for constipation, diarrhea and heartburn.  Genitourinary: Negative for dysuria and hematuria.  Musculoskeletal: Positive for back pain.  Skin: Negative for rash.  Neurological: Negative for dizziness.  Psychiatric/Behavioral: Positive for depression.  All other systems reviewed and are negative.   Physical Exam BP (!) 142/84 (BP Location: Left Arm)   Pulse 81   Temp 98 F (36.7 C) (Oral)   Resp 18   Ht  (1.676 m)   Wt 112 kg (247 lb)   SpO2 97%   BMI 39.87 kg/m  CONSTITUTIONAL: No acute distress HEENT:  Normocephalic, atraumatic, extraocular motion intact. NECK: Trachea is  midline, and there is no jugular venous distension. RESPIRATORY:  Lungs are clear, and breath sounds are equal bilaterally. Normal respiratory effort without pathologic use of accessory muscles. CARDIOVASCULAR: Heart is regular without murmurs, gallops, or rubs. GI: The abdomen is soft, obese, nondistended, tender to palpation in epigastric and right upper quadrant.  Positive Murphy's sign. There were no palpable masses.  Low midline vertical incision consistent with c-section is well healed. MUSCULOSKELETAL:  Normal muscle strength and tone in all four extremities.  No peripheral edema or cyanosis. SKIN: Skin turgor is normal. There are no pathologic skin lesions.  NEUROLOGIC:  Motor and sensation is grossly normal.  Cranial nerves are grossly intact. PSYCH:  Alert and oriented to person, place and time. Affect is normal.  Laboratory Analysis: Results for orders placed or performed during the hospital encounter of 06/20/16 (from the past 24 hour(s))  Basic metabolic panel     Status: Abnormal   Collection Time: 06/20/16 12:52 AM  Result Value Ref Range   Sodium 136 135 - 145 mmol/L   Potassium 3.5 3.5 - 5.1 mmol/L   Chloride 100 (L) 101 - 111 mmol/L   CO2 27 22 - 32 mmol/L   Glucose, Bld 129 (H) 65 - 99 mg/dL   BUN 10 6 - 20 mg/dL   Creatinine, Ser 1.61 (H) 0.44 - 1.00 mg/dL   Calcium 9.7 8.9 - 09.6 mg/dL   GFR calc non Af Amer 59 (L) >60 mL/min   GFR calc Af Amer >60 >60 mL/min   Anion gap 9 5 - 15  CBC     Status: Abnormal   Collection Time: 06/20/16 12:52 AM  Result Value Ref Range   WBC 9.9 3.6 - 11.0 K/uL   RBC 4.69 3.80 - 5.20 MIL/uL   Hemoglobin 12.7 12.0 - 16.0 g/dL   HCT 04.5 40.9 - 81.1 %   MCV 80.1 80.0 - 100.0 fL   MCH 27.1 26.0 - 34.0 pg   MCHC 33.8 32.0 - 36.0 g/dL   RDW 91.4 (H) 78.2 - 95.6 %   Platelets 255 150 - 440 K/uL  Troponin I     Status: None   Collection Time: 06/20/16 12:52 AM  Result Value Ref Range   Troponin I <0.03 <0.03 ng/mL  Lipase, blood      Status: None   Collection Time: 06/20/16 12:52 AM  Result Value Ref Range   Lipase 46 11 - 51 U/L  Troponin I     Status: None   Collection Time: 06/20/16  5:58 AM  Result Value Ref Range   Troponin I <0.03 <0.03 ng/mL  Hepatic function panel     Status: Abnormal   Collection Time: 06/20/16  5:58 AM  Result Value Ref Range   Total Protein 7.4 6.5 - 8.1 g/dL   Albumin 3.8 3.5 - 5.0 g/dL   AST 22 15 - 41 U/L   ALT 20 14 - 54 U/L   Alkaline Phosphatase 103 38 - 126 U/L   Total Bilirubin  0.6 0.3 - 1.2 mg/dL   Bilirubin, Direct <1.6 (L) 0.1 - 0.5 mg/dL   Indirect Bilirubin NOT CALCULATED 0.3 - 0.9 mg/dL    Imaging: Dg Chest 2 View  Result Date: 06/20/2016 CLINICAL DATA:  Diffuse abdominal pain and chest pain. Small bowel movement today but feels constipated. Symptoms started 3 days ago. Vomiting today. EXAM: CHEST  2 VIEW COMPARISON:  04/23/2012 FINDINGS: Normal heart size and pulmonary vascularity. Slight linear fibrosis or atelectasis in the left mid and lower lung, similar to prior study. No focal consolidation or airspace disease. No blunting of costophrenic angles. No pneumothorax. Small esophageal hiatal hernia suggested behind the heart. Calcified and tortuous aorta. Degenerative changes in the spine and shoulders. IMPRESSION: No evidence of active pulmonary disease. Linear fibrosis or atelectasis in the left lung base. Electronically Signed   By: Burman Nieves M.D.   On: 06/20/2016 01:18   Dg Abdomen 1 View  Result Date: 06/20/2016 CLINICAL DATA:  Diffuse abdominal pain and chest pain. Constipation. Symptoms started 3 days ago. Vomiting today. EXAM: ABDOMEN - 1 VIEW COMPARISON:  Lumbar spine 11/28/2013 FINDINGS: Gas and stool throughout the colon. No small or large bowel distention. No radiopaque stones. Degenerative changes in the spine and hips. IMPRESSION: Normal nonobstructive bowel gas pattern. Electronically Signed   By: Burman Nieves M.D.   On: 06/20/2016 02:28   Ct  Abdomen Pelvis W Contrast  Result Date: 06/20/2016 CLINICAL DATA:  Abdominal pain and bloating EXAM: CT ABDOMEN AND PELVIS WITH CONTRAST TECHNIQUE: Multidetector CT imaging of the abdomen and pelvis was performed using the standard protocol following bolus administration of intravenous contrast. CONTRAST:  ISOVUE-300 IOPAMIDOL (ISOVUE-300) INJECTION 61% COMPARISON:  None. FINDINGS: Lower chest: No acute abnormality. Hepatobiliary: The liver is within normal limits. No biliary ductal dilatation is seen. The gallbladder is well distended and demonstrates changes of wall thickening suspicious for acute cholecystitis. Pancreas: Unremarkable. No pancreatic ductal dilatation or surrounding inflammatory changes. Spleen: Normal in size without focal abnormality. Adrenals/Urinary Tract: Adrenal glands are unremarkable. Kidneys are normal, without renal calculi, focal lesion, or hydronephrosis. Bladder is unremarkable. Stomach/Bowel: The appendix is not well visualized. No inflammatory changes to suggest appendicitis are noted. No obstructive changes are seen. Vascular/Lymphatic: Aortic atherosclerosis. No enlarged abdominal or pelvic lymph nodes. Reproductive: Uterus and bilateral adnexa are unremarkable. Other: No abdominal wall hernia or abnormality. No abdominopelvic ascites. Musculoskeletal: Degenerative changes of lumbar spine are noted. IMPRESSION: Well distended gallbladder with gallbladder wall thickening and mild pericholecystic inflammatory change suspicious for acute cholecystitis. Ultrasound would be helpful for further evaluation. Electronically Signed   By: Alcide Clever M.D.   On: 06/20/2016 09:16   US Abdomen Limited Ruq  Result Date: 06/20/2016 CLINICAL DATA:  Right upper quadrant pain for several days, changes of cholecystitis on recent CT examination. EXAM: US ABDOMEN LIMITED - RIGHT UPPER QUADRANT COMPARISON:  None. FINDINGS: Gallbladder: Gallbladder is well distended and demonstrates  gallbladder wall thickening to 6 mm with evidence of cholelithiasis. Common bile duct: Diameter: 4 mm. Liver: Slight increased in echogenicity consistent with fatty infiltration. IMPRESSION: Changes consistent with acute calculous cholecystitis Electronically Signed   By: Alcide Clever M.D.   On: 06/20/2016 10:15    Assessment and Plan: This is a 61 y.o. female who presents with acute cholecystitis.  I have independently viewed her imaging studies and laboratory studies.  Her WBC is normal at 9.9 and LFTs within normal as well, but on CT and ultrasound, her gallbladder does appear distended with wall  thickening and stones within as well as pericholecystic fluid.  The patient will be admitted to the surgical team.  She will be NPO with IV fluid hydration and IV antibiotics.  She will have appropriate pain / nausea control and her essential home medications.  Currently given the duration of symptoms > 72 hrs, will attempt conservative management with IV antibiotics.  The patient does understand that if her pain and symptoms do not improve, that she may require surgical intervention during this admission.  Otherwise if she does improve, the plan would be for interval cholecystectomy in the future when all the inflammation has calmed down.  The patient understands this plan and all of her questions have been answered.   Howie Ill, MD [facility]

## 2016-06-20 NOTE — ED Notes (Signed)
CT scan notified of pt finishing contrast.

## 2016-06-20 NOTE — ED Triage Notes (Signed)
Pt complains of diffuse abdominal pain with chest pain, pt reports having a small bowel movement today but feels constipated, symptoms started 3 days ago, pt had 3 episodes of vomiting today

## 2016-06-20 NOTE — ED Notes (Signed)
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 Patient states she is bloated and has not had a good BM in a few days and now its pushing up into her chest. Patient is able to pass gas

## 2016-06-20 NOTE — Progress Notes (Signed)
CH received OR for [facility] for Pt in Rm 229. Pt talked about health struggles, depression, family, and loss of family members and friends. Pt was tearful. CH provided AD brochure and education. Pt stated she needs time to go over the AD. CH provided encouragement, prayers support and presence.    06/20/16 2000  Clinical Encounter Type  Visited With Patient  Visit Type Initial;Spiritual support  Referral From Nurse  Consult/Referral To Chaplain  Spiritual Encounters  Spiritual Needs Literature;Brochure;Prayer  Stress Factors  Patient Stress Factors Health changes  Family Stress Factors Health changes

## 2016-06-20 NOTE — ED Notes (Signed)
Pt resting on stretcher at this time with family at bedside.

## 2016-06-20 NOTE — ED Notes (Signed)
Pt returns from [place] at this time and will evaluate need for pain and nausea meds.

## 2016-06-21 LAB — CBC WITH DIFFERENTIAL/PLATELET
Basophils Absolute: 0 10*3/uL (ref 0–0.1)
Basophils Relative: 1 %
EOS ABS: 0.2 10*3/uL (ref 0–0.7)
EOS PCT: 4 %
HCT: 34.3 % — ABNORMAL LOW (ref 35.0–47.0)
Hemoglobin: 11.7 g/dL — ABNORMAL LOW (ref 12.0–16.0)
LYMPHS ABS: 2.1 10*3/uL (ref 1.0–3.6)
LYMPHS PCT: 40 %
MCH: 27.7 pg (ref 26.0–34.0)
MCHC: 34 g/dL (ref 32.0–36.0)
MCV: 81.4 fL (ref 80.0–100.0)
Monocytes Absolute: 0.3 10*3/uL (ref 0.2–0.9)
Monocytes Relative: 5 %
Neutro Abs: 2.7 10*3/uL (ref 1.4–6.5)
Neutrophils Relative %: 50 %
PLATELETS: 196 10*3/uL (ref 150–440)
RBC: 4.21 MIL/uL (ref 3.80–5.20)
RDW: 15.3 % — AB (ref 11.5–14.5)
WBC: 5.3 10*3/uL (ref 3.6–11.0)

## 2016-06-21 LAB — COMPREHENSIVE METABOLIC PANEL
ALBUMIN: 3.4 g/dL — AB (ref 3.5–5.0)
ALT: 21 U/L (ref 14–54)
ANION GAP: 7 (ref 5–15)
AST: 22 U/L (ref 15–41)
Alkaline Phosphatase: 95 U/L (ref 38–126)
BUN: 12 mg/dL (ref 6–20)
CALCIUM: 8.8 mg/dL — AB (ref 8.9–10.3)
CO2: 28 mmol/L (ref 22–32)
Chloride: 102 mmol/L (ref 101–111)
Creatinine, Ser: 1 mg/dL (ref 0.44–1.00)
GFR calc non Af Amer: 60 mL/min — ABNORMAL LOW (ref >60)
GLUCOSE: 112 mg/dL — AB (ref 65–99)
POTASSIUM: 3.7 mmol/L (ref 3.5–5.1)
Sodium: 137 mmol/L (ref 135–145)
Total Bilirubin: 0.5 mg/dL (ref 0.3–1.2)
Total Protein: 6.7 g/dL (ref 6.5–8.1)

## 2016-06-21 LAB — GLUCOSE, CAPILLARY
GLUCOSE-CAPILLARY: 136 mg/dL — AB (ref 65–99)
GLUCOSE-CAPILLARY: 108 mg/dL — AB (ref 65–99)
GLUCOSE-CAPILLARY: 139 mg/dL — AB (ref 65–99)
Glucose-Capillary: 110 mg/dL — ABNORMAL HIGH (ref 65–99)
Glucose-Capillary: 273 mg/dL — ABNORMAL HIGH (ref 65–99)
Glucose-Capillary: 94 mg/dL (ref 65–99)

## 2016-06-21 LAB — MAGNESIUM: Magnesium: 2.1 mg/dL (ref 1.7–2.4)

## 2016-06-21 MED ORDER — SODIUM CHLORIDE 0.9 % IV SOLN
INTRAVENOUS | Status: DC
  Administered 2016-06-21 – 2016-06-22 (×3): via INTRAVENOUS

## 2016-06-21 MED ORDER — BUPROPION HCL ER (XL) 150 MG PO TB24
150.0000 mg | ORAL_TABLET | Freq: Every day | ORAL | Status: DC
  Administered 2016-06-21 – 2016-06-23 (×3): 150 mg via ORAL
  Filled 2016-06-21 (×3): qty 1

## 2016-06-21 MED ORDER — ALUM & MAG HYDROXIDE-SIMETH 200-200-20 MG/5ML PO SUSP
15.0000 mL | Freq: Four times a day (QID) | ORAL | Status: DC | PRN
  Administered 2016-06-21: 15 mL via ORAL
  Filled 2016-06-21: qty 30

## 2016-06-21 MED ORDER — SERTRALINE HCL 100 MG PO TABS
200.0000 mg | ORAL_TABLET | Freq: Every day | ORAL | Status: DC
  Administered 2016-06-21 – 2016-06-23 (×3): 200 mg via ORAL
  Filled 2016-06-21 (×3): qty 2

## 2016-06-21 MED ORDER — LACTATED RINGERS IV SOLN: INTRAVENOUS | Status: DC

## 2016-06-21 NOTE — Care Management Note (Addendum)
Case Management Note  Patient Details  Name: KHRYSTYNA SCHWALM MRN: [id number] Date of Birth: 11-18-1955  Subjective/Objective:                  Spoke with patient after realizing patient was listed as self-pay. I confirmed with patient that she does not have health insurance. She is established with [facility] in [place] however she lives in [place] now. She states that they assist her with her medications. She states she has trouble walking and fatigues easily. She requests a walker with seat.   Action/Plan:  RNCM will check to see if CM department has any donated rollators in stock and supply this to patient. RNCM to follow for mediation assistance also. CM does not have any in stock- I will check with [facility].  Expected Discharge Date:                  Expected Discharge Plan:     In-House Referral:     Discharge planning Services  CM Consult, Medication Assistance, Indigent Health Clinic  Post Acute Care Choice:    Choice offered to:  Patient  DME Arranged:  Walker rolling with seat DME Agency:     HH Arranged:    HH Agency:     Status of Service:  In process, will continue to follow  If discussed at Long Length of Stay Meetings, dates discussed:    Additional Comments:  Collie Siad, RN 06/21/2016, 8:31 AM

## 2016-06-21 NOTE — Plan of Care (Signed)
Problem: Fluid Volume: Goal: Ability to maintain a balanced intake and output will improve Outcome: Not Progressing Patient is NPO.  Problem: Nutrition: Goal: Adequate nutrition will be maintained Outcome: Not Progressing Patient is NPO.  Problem: Fluid Volume: Goal: Ability to maintain a balanced intake and output will improve Outcome: Not Progressing Patient is NPO.

## 2016-06-21 NOTE — Progress Notes (Signed)
06/21/2016  Subjective: No acute events overnight. Patient reports this morning that her pain has improved compared to yesterday. Denies any nausea.  Vital signs: Temp:  [97.6 F (36.4 C)-98.2 F (36.8 C)] 98 F (36.7 C) (04/18 0535) Pulse Rate:  [74-90] 78 (04/18 0535) Resp:  [17-20] 20 (04/18 0535) BP: (111-147)/(61-86) 116/63 (04/18 0535) SpO2:  [93 %-100 %] 100 % (04/18 0535)   Intake/Output: 04/17 0701 - 04/18 0700 In: 1291.7 [I.V.:191.7; IV Piggyback:1100] Out: 400 [Urine:400] Last BM Date: 06/20/16  Physical Exam: Constitutional: No Acute distress Abdomen:  Soft, nondistended, obese, with mild tenderness to palpation of the right upper quadrant. This is definitely improved compared to yesterday's examination.  Labs:   Recent Labs  06/20/16 0052 06/21/16 0414  WBC 9.9 5.3  HGB 12.7 11.7*  HCT 37.6 34.3*  PLT 255 196    Recent Labs  06/20/16 0052 06/21/16 0414  NA 136 137  K 3.5 3.7  CL 100* 102  CO2 27 28  GLUCOSE 129* 112*  BUN 10 12  CREATININE 1.02* 1.00  CALCIUM 9.7 8.8*   No results for input(s): LABPROT, INR in the last 72 hours.  Imaging: US Abdomen Limited Ruq  Result Date: 06/20/2016 CLINICAL DATA:  Right upper quadrant pain for several days, changes of cholecystitis on recent CT examination. EXAM: US ABDOMEN LIMITED - RIGHT UPPER QUADRANT COMPARISON:  None. FINDINGS: Gallbladder: Gallbladder is well distended and demonstrates gallbladder wall thickening to 6 mm with evidence of cholelithiasis. Common bile duct: Diameter: 4 mm. Liver: Slight increased in echogenicity consistent with fatty infiltration. IMPRESSION: Changes consistent with acute calculous cholecystitis Electronically Signed   By: Alcide Clever M.D.   On: 06/20/2016 10:15    Assessment/Plan: 61 year old female with acute cholecystitis.  -Clinically the patient continues to improve. Her white blood cell count has also improved this morning. -We'll start the patient on ice chips  and sips of water this morning given that she still has some pain in the right upper quadrant. Depending on her progress we may be able to transition to clear liquids later today. -Continue IV antibiotics for now and likely transition to oral antibiotics tomorrow.   Howie Ill, MD [facility]

## 2016-06-21 NOTE — Progress Notes (Signed)
Called Dr. Tonita Cong regarding changing diet order to clear liquids per patient request.  Appropriate orders were placed.  Maria Conway 06/21/2016 9:01 PM

## 2016-06-22 LAB — GLUCOSE, CAPILLARY
GLUCOSE-CAPILLARY: 84 mg/dL (ref 65–99)
GLUCOSE-CAPILLARY: 201 mg/dL — AB (ref 65–99)
Glucose-Capillary: 279 mg/dL — ABNORMAL HIGH (ref 65–99)
Glucose-Capillary: 101 mg/dL — ABNORMAL HIGH (ref 65–99)
Glucose-Capillary: 164 mg/dL — ABNORMAL HIGH (ref 65–99)

## 2016-06-22 LAB — CBC
HEMATOCRIT: 34.1 % — AB (ref 35.0–47.0)
HEMOGLOBIN: 11.4 g/dL — AB (ref 12.0–16.0)
MCH: 27.5 pg (ref 26.0–34.0)
MCHC: 33.3 g/dL (ref 32.0–36.0)
MCV: 82.6 fL (ref 80.0–100.0)
Platelets: 192 10*3/uL (ref 150–440)
RBC: 4.13 MIL/uL (ref 3.80–5.20)
RDW: 14.9 % — ABNORMAL HIGH (ref 11.5–14.5)
WBC: 5 10*3/uL (ref 3.6–11.0)

## 2016-06-22 MED ORDER — METFORMIN HCL ER 500 MG PO TB24
1000.0000 mg | ORAL_TABLET | Freq: Two times a day (BID) | ORAL | Status: DC
  Administered 2016-06-22: 1000 mg via ORAL
  Filled 2016-06-22 (×3): qty 2

## 2016-06-22 MED ORDER — METFORMIN HCL ER 500 MG PO TB24
1000.0000 mg | ORAL_TABLET | Freq: Every day | ORAL | Status: DC
  Administered 2016-06-23: 1000 mg via ORAL

## 2016-06-22 MED ORDER — EXENATIDE 5 MCG/0.02ML ~~LOC~~ SOPN
5.0000 ug | PEN_INJECTOR | Freq: Two times a day (BID) | SUBCUTANEOUS | Status: DC
  Administered 2016-06-22 – 2016-06-23 (×2): 5 ug via SUBCUTANEOUS
  Filled 2016-06-22: qty 1.2

## 2016-06-22 MED ORDER — INSULIN ASPART 100 UNIT/ML ~~LOC~~ SOLN
0.0000 [IU] | Freq: Three times a day (TID) | SUBCUTANEOUS | Status: DC
  Administered 2016-06-22: 5 [IU] via SUBCUTANEOUS
  Administered 2016-06-23: 2 [IU] via SUBCUTANEOUS
  Administered 2016-06-23: 3 [IU] via SUBCUTANEOUS
  Filled 2016-06-22: qty 2
  Filled 2016-06-22: qty 3
  Filled 2016-06-22: qty 5

## 2016-06-22 MED ORDER — INSULIN ASPART 100 UNIT/ML ~~LOC~~ SOLN: 0.0000 [IU] | Freq: Every day | SUBCUTANEOUS | Status: DC

## 2016-06-22 MED ORDER — FLUCONAZOLE 50 MG PO TABS
150.0000 mg | ORAL_TABLET | Freq: Once | ORAL | Status: AC
  Administered 2016-06-22: 150 mg via ORAL
  Filled 2016-06-22: qty 3

## 2016-06-22 MED ORDER — AMOXICILLIN-POT CLAVULANATE 875-125 MG PO TABS
1.0000 | ORAL_TABLET | Freq: Two times a day (BID) | ORAL | Status: DC
  Administered 2016-06-22 – 2016-06-23 (×3): 1 via ORAL
  Filled 2016-06-22 (×3): qty 1

## 2016-06-22 MED ORDER — SIMETHICONE 80 MG PO CHEW
160.0000 mg | CHEWABLE_TABLET | Freq: Four times a day (QID) | ORAL | Status: DC | PRN
  Administered 2016-06-22: 160 mg via ORAL
  Filled 2016-06-22: qty 2

## 2016-06-22 NOTE — Evaluation (Signed)
Physical Therapy Evaluation Patient Details Name: Maria Conway MRN: [id number] DOB: 25-Nov-1955 Today's Date: 06/22/2016   History of Present Illness  Pt is a 61 y/o F who presents with abdominal pain.  Pt admitted with acute cholecystitis which, per most recent Surgery note, will be treated conservatively.  Pt's PMH includes chronic back pain, gastroparesis.     Clinical Impression  Pt admitted with above diagnosis. Pt currently with functional limitations due to the deficits listed below (see PT Problem List). Maria Conway present with chronic low back pain since work injury in 2010 which limits her ambulatory distance and ability to participate in extensive balance testing this date.  She requires supervision to ambulate with cues provided on proper technique using RW.  Pt's goal is to be able to ambulate farther so she can participate in the community and go to her grandson's baseball games.  Given pt's current mobility status with limited ambulatory endurance, recommending that pt receive a rollator.   Pt will benefit from skilled PT to increase their independence and safety with mobility to allow discharge to the venue listed below.      Follow Up Recommendations Outpatient PT    Equipment Recommendations  Other (comment) (Rollator)    Recommendations for Other Services       Precautions / Restrictions Precautions Precautions: Fall;Other (comment) Precaution Comments: impulsive Restrictions Weight Bearing Restrictions: No      Mobility  Bed Mobility Overal bed mobility: Independent             General bed mobility comments: No physical assist or cues needed.    Transfers Overall transfer level: Independent Equipment used: None;Rolling walker (2 wheeled)             General transfer comment: No physical assist or cues needed.  No unsteadiness noted with sit<>stand without AD or with RW.  Ambulation/Gait Ambulation/Gait assistance: Supervision Ambulation  Distance (Feet): 100 Feet Assistive device: Rolling walker (2 wheeled) Gait Pattern/deviations: Step-through pattern;Decreased stride length;Trunk flexed Gait velocity: decreased Gait velocity interpretation: Below normal speed for age/gender General Gait Details: Pt requires cues for upright posture and proper management of RW which pt is initially able to maintain; however, as pt begins to fatigue after ambulating 80 ft she begins pushing RW too far ahead of her and demonstrates flexed posture.  Stairs            Wheelchair Mobility    Modified Rankin (Stroke Patients Only)       Balance Overall balance assessment: Needs assistance;History of Falls Sitting-balance support: No upper extremity supported;Feet supported Sitting balance-Leahy Scale: Normal     Standing balance support: No upper extremity supported;During functional activity Standing balance-Leahy Scale: Fair Standing balance comment: Pt able to remain steady with static and dynamic activities without UE suppor         Rhomberg - Eyes Opened: 30 Rhomberg - Eyes Closed: 15 (before pt impulsively says "I need to sit down" due to pain)                 Pertinent Vitals/Pain Pain Assessment: Faces Faces Pain Scale: Hurts even more Pain Location: from low back down posterior LLE down to L foot (This is chronic pain since injury at work in 2010) Pain Descriptors / Indicators: Aching;Grimacing;Moaning Pain Intervention(s): Limited activity within patient's tolerance;Monitored during session;Repositioned    Home Living Family/patient expects to be discharged to:: Private residence Living Arrangements: Children (daughter gone during the day at work) Available Help at Discharge: Family;Available  PRN/intermittently Type of Home: Apartment Home Access: Elevator     Home Layout: One level Home Equipment: None      Prior Function Level of [place]: [place]         Comments: Pt does not drive  so daughter does the driving for her.  Pt reports she is ind with bathing, dressing, cooking.  Daughter does the cleaning.  She reports ~1 fall/month and notices this occurs most frequently with anterior bias with sit>stand.  Reports she ambulates household distances before onset of fatigue.     Hand Dominance        Extremity/Trunk Assessment   Upper Extremity Assessment Upper Extremity Assessment: Overall WFL for tasks assessed    Lower Extremity Assessment Lower Extremity Assessment: LLE deficits/detail LLE Deficits / Details: Strength grossly 4/5 LLE    Cervical / Trunk Assessment Cervical / Trunk Assessment: Other exceptions Cervical / Trunk Exceptions: Flexed posture during upright activities due to back pain  Communication   Communication: No difficulties  Cognition Arousal/Alertness: Awake/alert Behavior During Therapy: Impulsive Overall Cognitive Status: Within Functional Limits for tasks assessed                                        General Comments General comments (skin integrity, edema, etc.): Pt unable to tolerate more extensive balance testing due to chronic low back pain.    Exercises     Assessment/Plan    PT Assessment Patient needs continued PT services  PT Problem List Decreased strength;Decreased activity tolerance;Decreased balance;Decreased knowledge of use of DME;Decreased safety awareness;Pain       PT Treatment Interventions DME instruction;Gait training;Functional mobility training;Therapeutic activities;Therapeutic exercise;Balance training;Patient/family education    PT Goals (Current goals can be found in the Care Plan section)  Acute Rehab PT Goals Patient Stated Goal: to be able to walk farther and use rollator to take rest breaks when needed PT Goal Formulation: With patient Time For Goal Achievement: 07/06/16 Potential to Achieve Goals: Fair    Frequency Min 2X/week   Barriers to discharge         Co-evaluation               End of Session Equipment Utilized During Treatment: Gait belt Activity Tolerance: Patient limited by pain Patient left: in chair;with call bell/phone within reach;with chair alarm set;with nursing/sitter in room Nurse Communication: Mobility status PT Visit Diagnosis: Unsteadiness on feet (R26.81);History of falling (Z91.81)    Time: 0865-7846 PT Time Calculation (min) (ACUTE ONLY): 17 min   Charges:   PT Evaluation $PT Eval Low Complexity: 1 Procedure     PT G Codes:        Encarnacion Chu PT, DPT 06/22/2016, 11:06 AM

## 2016-06-22 NOTE — Progress Notes (Signed)
06/22/2016  Subjective: No acute events overnight. Patient was started on clears last night for dinner. Pain is well-controlled and improved.  Vital signs: Temp:  [98.2 F (36.8 C)-98.7 F (37.1 C)] 98.3 F (36.8 C) (04/19 1156) Pulse Rate:  [77-92] 92 (04/19 1156) Resp:  [18-20] 18 (04/19 1156) BP: (121-149)/(66-82) 142/70 (04/19 1156) SpO2:  [93 %-99 %] 97 % (04/19 1156)   Intake/Output: 04/18 0701 - 04/19 0700 In: 922 [P.O.:440; I.V.:482] Out: 1100 [Urine:1100] Last BM Date: 06/19/16  Physical Exam: Constitutional: No acute distress Abdomen:  Soft, obese, nondistended, nontender to palpation.  Labs:   Recent Labs  06/21/16 0414 06/22/16 0442  WBC 5.3 5.0  HGB 11.7* 11.4*  HCT 34.3* 34.1*  PLT 196 192    Recent Labs  06/20/16 0052 06/21/16 0414  NA 136 137  K 3.5 3.7  CL 100* 102  CO2 27 28  GLUCOSE 129* 112*  BUN 10 12  CREATININE 1.02* 1.00  CALCIUM 9.7 8.8*   No results for input(s): LABPROT, INR in the last 72 hours.  Imaging: No results found.  Assessment/Plan: 61 year old female with acute cholecystitis.  -We'll advance to a carb modified diet for lunch today. Discontinue IV fluids. Transition antibiotics to oral antibiotics today. -Likely discharge to home tomorrow.   Howie Ill, MD [facility]

## 2016-06-23 LAB — GLUCOSE, CAPILLARY
GLUCOSE-CAPILLARY: 139 mg/dL — AB (ref 65–99)
Glucose-Capillary: 171 mg/dL — ABNORMAL HIGH (ref 65–99)

## 2016-06-23 MED ORDER — AMOXICILLIN-POT CLAVULANATE 875-125 MG PO TABS: 1.0000 | ORAL_TABLET | Freq: Two times a day (BID) | ORAL | 0 refills | Status: AC

## 2016-06-23 MED ORDER — OXYCODONE HCL 5 MG PO TABS: 5.0000 mg | ORAL_TABLET | Freq: Four times a day (QID) | ORAL | 0 refills | Status: DC | PRN

## 2016-06-23 NOTE — Discharge Summary (Signed)
Patient ID: Maria Conway MRN: [id number] DOB/AGE: 04/22/1955 61 y.o.  Admit date: 06/20/2016 Discharge date: 06/23/2016   Discharge Diagnoses:  Active Problems:   Acute cholecystitis   Procedures:  None  Hospital Course:  Patient was admitted on 06/20/16 with a 5 day history of abdominal pain, nausea, vomiting, and was diagnosed with acute cholecystitis.  Given the duration of symptoms, patient was admitted for conservative management.  Was NPO with IV fluid hydration and IV antibiotics.  Her pain improved and her diet was advanced slowly.  Her antibiotics were transitioned to oral version.  She reported feeling a vaginal yeast infection starting and was having some vaginal discharge, so was given a one time dose of Fluconazole.  PT evaluated the patient due to deconditioning and recommended outpatient PT.  A rolling walker was ordered for her.  Prior to discharge, she had no further pain, with normal WBC, tolerating a diet, and was deemed ready for discharge to home.  On exam, she was in no acute distress with stable normal vital signs.  Her abdomen was soft, non-distended, and non-tender to palpation.  Consults:  PT  Disposition: 01-Home or Self Care  Discharge Instructions    Activity as tolerated - No restrictions    Complete by:  As directed    Call MD for:  difficulty breathing, headache or visual disturbances    Complete by:  As directed    Call MD for:  persistant nausea and vomiting    Complete by:  As directed    Call MD for:  severe uncontrolled pain    Complete by:  As directed    Call MD for:  temperature >100.4    Complete by:  As directed    Diet - low sodium heart healthy    Complete by:  As directed    Low fat diet.   Discharge instructions    Complete by:  As directed    1.  Complete antibiotic course as instructed.   Driving Restrictions    Complete by:  As directed    Do not drive while taking narcotics for pain control.     Allergies as of 06/23/2016       Reactions   Aspartame And Phenylalanine Other (See Comments), Cough   Reaction: throat irritation   Lisinopril Cough      Medication List    TAKE these medications   albuterol 108 (90 Base) MCG/ACT inhaler Commonly known as:  PROVENTIL HFA;VENTOLIN HFA Inhale 2 puffs into the lungs every 4 (four) hours as needed for wheezing or shortness of breath.   amLODipine 10 MG tablet Commonly known as:  NORVASC Take 10 mg by mouth daily.   amoxicillin-clavulanate 875-125 MG tablet Commonly known as:  AUGMENTIN Take 1 tablet by mouth every 12 (twelve) hours.   beclomethasone 40 MCG/ACT inhaler Commonly known as:  QVAR Inhale 2 puffs into the lungs 2 (two) times daily.   exenatide 5 MCG/0.02ML Sopn injection Commonly known as:  BYETTA Inject 5 mcg into the skin 2 (two) times daily.   gabapentin 600 MG tablet Commonly known as:  NEURONTIN Take 600 mg by mouth 4 (four) times daily.   hydrochlorothiazide 25 MG tablet Commonly known as:  HYDRODIURIL Take 25 mg by mouth daily.   insulin regular 250 units/2.28mL (100 units/mL) injection Commonly known as:  NOVOLIN R,HUMULIN R Inject 2-10 Units into the skin 3 (three) times daily before meals. Per sliding scale   lisdexamfetamine 30 MG capsule Commonly known as:  VYVANSE Take 30 mg by mouth daily.   metFORMIN 500 MG 24 hr tablet Commonly known as:  GLUCOPHAGE-XR Take 1,000 mg by mouth 2 (two) times daily.   oxyCODONE 5 MG immediate release tablet Commonly known as:  Oxy IR/ROXICODONE Take 1-2 tablets (5-10 mg total) by mouth every 6 (six) hours as needed for severe pain.            Durable Medical Equipment        Start     Ordered   06/23/16 1406  For home use only DME Walker rolling  Once    Comments:  4 wheel rolling walker with seat please.  Question:  Patient needs a walker to treat with the following condition  Answer:  Physical deconditioning   06/23/16 1405     Follow-up Information    Henrene Dodge, MD  Follow up on 07/10/2016.   Specialty:  Surgery Why:  Arrive at 1:15 pm for appointment. Contact information: [address] [place] [address] [phone]

## 2016-06-23 NOTE — Progress Notes (Signed)
06/23/2016 6:05 PM  BP (!) 145/82 (BP Location: Left Arm)   Pulse 85   Temp 98 F (36.7 C) (Oral)   Resp 19   Ht  (1.676 m)   Wt 112 kg (247 lb)   SpO2 96%   BMI 39.87 kg/m  Patient discharged per MD orders. Discharge instructions reviewed with patient and patient verbalized understanding. IV removed per policy. Insulin given back to patient. Prescriptions discussed and given to patient. Discharged via wheelchair escorted by nursing staff.  Ron Parker, RN

## 2016-06-23 NOTE — Care Management (Signed)
PT has evaluated patient and recommend outpatient PT.  Patient to follow up at [facility].  PT has recommended rollator.  Order was placed.  Delivered to room by [facility] with [facility].  RNCM signing off.

## 2016-06-23 NOTE — Progress Notes (Addendum)
Inpatient Diabetes Program Recommendations  AACE/ADA: New Consensus Statement on Inpatient Glycemic Control (2015)  Target Ranges:  Prepandial:   less than 140 mg/dL      Peak postprandial:   less than 180 mg/dL (1-2 hours)      Critically ill patients:  140 - 180 mg/dL   Lab Results  Component Value Date   GLUCAP 139 (H) 06/23/2016    Review of Glycemic Control  Results for Maria Conway, Maria Conway (MRN [id number]) as of 06/23/2016 08:58  Ref. Range 06/22/2016 07:31 06/22/2016 11:10 06/22/2016 16:17 06/22/2016 21:28 06/23/2016 07:52  Glucose-Capillary Latest Ref Range: 65 - 99 mg/dL 782 (H) 956 (H) 213 (H) 164 (H) 139 (H)    Diabetes history: Type 2 Outpatient Diabetes medications: Humulin R 2-10 units per sliding scale q2h (for blood sugars over /dl), Glucophage XR  bid, Byetta bid  Current orders for Inpatient glycemic control: Byetta bid, Novolog 0-15units tid, Novolog 0-5 units qhs, Metformin1000mg  bid  Inpatient Diabetes Program Recommendations: Consider adding mealtime insulin coverage, Novolog 3 units tid and decreasing the Novolog correction to sensitive scale 0-9units.  This will ensure the patient gets some insulin at each meal.   *Please give detailed discharge instructions for insulin- patient reports taking R insulin q2h as long as blood sugars are greater than /dl  Susette Racer, RN, BA, [place], CDE Diabetes Coordinator Inpatient Diabetes Program  [phone] (Team Pager) [phone] [facility] Office) 06/23/2016 9:02 AM

## 2016-07-10 ENCOUNTER — Inpatient Hospital Stay: Payer: Self-pay | Admitting: Surgery

## 2016-07-11 ENCOUNTER — Telehealth: Payer: Self-pay

## 2016-07-11 ENCOUNTER — Ambulatory Visit (INDEPENDENT_AMBULATORY_CARE_PROVIDER_SITE_OTHER): Payer: Medicaid Other | Admitting: Surgery

## 2016-07-11 ENCOUNTER — Encounter: Payer: Self-pay | Admitting: Surgery

## 2016-07-11 DIAGNOSIS — S3992XA Unspecified injury of lower back, initial encounter: Secondary | ICD-10-CM

## 2016-07-11 DIAGNOSIS — E119 Type 2 diabetes mellitus without complications: Secondary | ICD-10-CM | POA: Diagnosis not present

## 2016-07-11 DIAGNOSIS — F419 Anxiety disorder, unspecified: Secondary | ICD-10-CM | POA: Diagnosis not present

## 2016-07-11 DIAGNOSIS — F329 Major depressive disorder, single episode, unspecified: Secondary | ICD-10-CM

## 2016-07-11 DIAGNOSIS — I10 Essential (primary) hypertension: Secondary | ICD-10-CM

## 2016-07-11 DIAGNOSIS — F909 Attention-deficit hyperactivity disorder, unspecified type: Secondary | ICD-10-CM

## 2016-07-11 DIAGNOSIS — F4310 Post-traumatic stress disorder, unspecified: Secondary | ICD-10-CM

## 2016-07-11 DIAGNOSIS — G894 Chronic pain syndrome: Secondary | ICD-10-CM | POA: Insufficient documentation

## 2016-07-11 DIAGNOSIS — J45909 Unspecified asthma, uncomplicated: Secondary | ICD-10-CM | POA: Diagnosis not present

## 2016-07-11 DIAGNOSIS — F32A Depression, unspecified: Secondary | ICD-10-CM | POA: Insufficient documentation

## 2016-07-11 DIAGNOSIS — F5081 Binge eating disorder: Secondary | ICD-10-CM | POA: Diagnosis not present

## 2016-07-11 MED ORDER — OMEPRAZOLE 20 MG PO CPDR: 20.0000 mg | DELAYED_RELEASE_CAPSULE | Freq: Every day | ORAL | 3 refills | Status: AC

## 2016-07-11 NOTE — Telephone Encounter (Signed)
I have [facility] Phone #: [phone] Fax #: [phone] & received a confirmation.   I have put in an internal referral to [facility] as well.   I will follow up within 3-5 days to make sure the appointments have been scheduled.

## 2016-07-11 NOTE — Progress Notes (Signed)
07/11/2016  History of Present Illness: Maria Conway is a 61 y.o. female admitted in the hospital from 4/17-4/20 with acute cholecystitis, treated conservatively with antibiotics.  The patient responded well and her diet was advanced slowly and was discharged in good condition with a prescription for Augmentin.  She presents today for follow up.    The patient says that she's doing ok.  She reports having one episode of RUQ pain early after discharge but that this pain was much less intense compared to her admission.  She says that she has multiples episodes of nausea per day with some foamy reflux at times, associated with feeling very bloated and full after eating.  This has been the same since even before her admission and appears to be her baseline.  She does have a history of gastroparesis.  She says she does not check her blood glucose often at all, and her diabetes medications were recently adjusted by her PCP.  She denies having any fevers, chills, chest pain, and no longer has pain with deep breathing like she did when she was admitted.  Denies any other areas of abdominal pain, constipation, or diarrhea.  Past Medical History: Past Medical History:  Diagnosis Date  . ADHD (attention deficit hyperactivity disorder)   . Anxiety   . Asthma   . Back injury   . Chronic pain disorder   . Depression   . Diabetes mellitus without complication (HCC)   . Hypertension   . PTSD (post-traumatic stress disorder) Pelvic mass Back injury      Past Surgical History: Past Surgical History:  Procedure Laterality Date  . CESAREAN SECTION      Home Medications: Prior to Admission medications   Medication Sig Start Date End Date Taking? Authorizing Provider  albuterol (PROVENTIL HFA;VENTOLIN HFA) 108 (90 Base) MCG/ACT inhaler Inhale 2 puffs into the lungs every 4 (four) hours as needed for wheezing or shortness of breath.    [provider]  amLODipine (NORVASC) 10 MG tablet Take  10 mg by mouth daily.    [provider]  beclomethasone (QVAR) 40 MCG/ACT inhaler Inhale 2 puffs into the lungs 2 (two) times daily.    [provider]  buPROPion (WELLBUTRIN XL) 150 MG 24 hr tablet Take 150 mg by mouth daily.    [provider]  exenatide (BYETTA) 5 MCG/0.02ML SOPN injection Inject 5 mcg into the skin 2 (two) times daily.    [provider]  gabapentin (NEURONTIN) 600 MG tablet Take 600 mg by mouth 4 (four) times daily.    [provider]  hydrochlorothiazide (HYDRODIURIL) 25 MG tablet Take 25 mg by mouth daily.    [provider]  insulin aspart (NOVOLOG FLEXPEN) 100 UNIT/ML FlexPen Inject 2 Units into the skin daily.    [provider]  insulin regular (NOVOLIN R,HUMULIN R) 250 units/2.67mL (100 units/mL) injection Inject 2-10 Units into the skin 3 (three) times daily before meals. Per sliding scale    [provider]  lisdexamfetamine (VYVANSE) 30 MG capsule Take 30 mg by mouth daily.    [provider]  metFORMIN (GLUCOPHAGE-XR) 500 MG 24 hr tablet Take 1,000 mg by mouth 2 (two) times daily.    [provider]  ORPHENADRINE-ASA-CAFFEINE [phone] MG TABS Take 1 tablet by mouth daily. 04/14/09   [provider]  oxyCODONE (OXY IR/ROXICODONE) 5 MG immediate release tablet Take 1-2 tablets (5-10 mg total) by mouth every 6 (six) hours as needed for severe pain. 06/23/16  Henrene DodgePiscoya, Emmajane Altamura, MD  tiZANidine (ZANAFLEX) 4 MG capsule Take 4 mg by mouth 3 (three) times daily. 02/02/16   [provider]    Allergies: Allergies  Allergen Reactions  . Aspartame And Phenylalanine Other (See Comments) and Cough    Reaction: throat irritation  . Lisinopril Cough    Other reaction(s): Cough    Review of Systems: Review of Systems  Constitutional: Negative for chills and fever.  HENT: Negative for hearing loss.   Respiratory: Negative for shortness of breath.   Cardiovascular:  Negative for chest pain.  Gastrointestinal: Positive for nausea. Negative for abdominal pain, constipation and diarrhea.  Genitourinary: Negative for dysuria.  Musculoskeletal: Negative for myalgias.  Neurological: Negative for dizziness.  Psychiatric/Behavioral: Negative for depression.  All other systems reviewed and are negative.   Physical Exam BP (!) 162/81   Pulse 87   Ht 5\' 6"  (1.676 m)   Wt 112.9 kg (249 lb)   BMI 40.19 kg/m  CONSTITUTIONAL: No acute distress RESPIRATORY:  Lungs are clear, and breath sounds are equal bilaterally. Normal respiratory effort without pathologic use of accessory muscles. CARDIOVASCULAR: Heart is regular without murmurs, gallops, or rubs. GI: The abdomen is soft, obese, non-distended, non-tender to palpation.  Negative Murphy's sign. There were no palpable masses.  NEUROLOGIC:  Motor and sensation is grossly normal.  Cranial nerves are grossly intact. PSYCH:  Alert and oriented to person, place and time. Affect is normal.  Labs/Imaging: None  Assessment and Plan: This is a 61 y.o. female recently discharged from the hospital after a bout of acute cholecystitis, treated conservatively with antibiotics.   Currently from the gallbladder standpoint, she appears to be doing better with only one episode of temporary mild pain.    It does appear that she has issues with gastroparesis and her diabetes may not be well controlled.  She does not check her blood glucose and she believes her last Hgb A1c was around 8.1.  This may definitely be contributing to her symptoms.  On her medical records, she also mentioned to one of her doctors that she was told she had gastritis in [place], but she does not take any antiacid medications.    There are no acute surgical issues at this time and she does not need a cholecystectomy urgently.  I did recommend that we do the following for her first:  1.  Referral to GI for evaluation for gastritis and gastroparesis and  possible management.  Her current symptoms do not appear to be related to her gallbladder.  She will continue with a low-fat diet.  Will start the patient preemptively on Prilosec 20 mg daily. 2.  Patient should see her PCP soon again for better glucose control.  She needs more diabetes education and assistance.  She is aware that by controlling her diabetes better, she will have fewer risk of complications post-op. 3.  Patient is also interested in weight loss and wants a referral for Bariatric Surgery.  Will send a referral today as well.  Patient will then follow up with us approximately a month after her appointments to evaluate her progress and possibly discuss surgical options for her gallbladder.   Howie IllJose Luis Aubree Doody, MD [facility]

## 2016-07-11 NOTE — Patient Instructions (Addendum)
We will place a referral to GI and Bariatrics. I will let you know when your appointments are scheduled.  We will start you on Omeprazole to see if this will help with your nausea/vomiting symptoms. This has been sent to your pharmacy.  We will have you follow-up with your Primary Care Physician to optimize your Diabetes and make sure this is controlled as well as possible. They can also assist in teaching you to use your meter and help with any difficulties that you may have with this.  We will see you 1 month after seeing the GI Physician and your PCP.     Gastroparesis Gastroparesis, also called delayed gastric emptying, is a condition in which food takes longer than normal to empty from the stomach. The condition is usually long-lasting (chronic). What are the causes? This condition may be caused by:  An endocrine disorder, such as hypothyroidism or diabetes. Diabetes is the most common cause of this condition.  A nervous system disease, such as Parkinson disease or multiple sclerosis.  Cancer, infection, or surgery of the stomach or vagus nerve.  A connective tissue disorder, such as scleroderma.  Certain medicines. In most cases, the cause is not known. What increases the risk? This condition is more likely to develop in:  People with certain disorders, including endocrine disorders, eating disorders, amyloidosis, and scleroderma.  People with certain diseases, including Parkinson disease or multiple sclerosis.  People with cancer or infection of the stomach or vagus nerve.  People who have had surgery on the stomach or vagus nerve.  People who take certain medicines.  Women. What are the signs or symptoms? Symptoms of this condition include:  An early feeling of fullness when eating.  Nausea.  Weight loss.  Vomiting.  Heartburn.  Abdominal bloating.  Inconsistent blood glucose levels.  Lack of appetite.  Acid from the stomach coming up into the  esophagus (gastroesophageal reflux).  Spasms of the stomach. Symptoms may come and go. How is this diagnosed? This condition is diagnosed with tests, such as:  Tests that check how long it takes food to move through the stomach and intestines. These tests include:  Upper gastrointestinal (GI) series. In this test, X-rays of the intestines are taken after you drink a liquid. The liquid makes the intestines show up better on the X-rays.  Gastric emptying scintigraphy. In this test, scans are taken after you eat food that contains a small amount of radioactive material.  Wireless capsule GI monitoring system. This test involves swallowing a capsule that records information about movement through the stomach.  Gastric manometry. This test measures electrical and muscular activity in the stomach. It is done with a thin tube that is passed down the throat and into the stomach.  Endoscopy. This test checks for abnormalities in the lining of the stomach. It is done with a long, thin tube that is passed down the throat and into the stomach.  An ultrasound. This test can help rule out gallbladder disease or pancreatitis as a cause of your symptoms. It uses sound waves to take pictures of the inside of your body. How is this treated? There is no cure for gastroparesis. This condition may be managed with:  Treatment of the underlying condition causing the gastroparesis.  Lifestyle changes, including exercise and dietary changes. Dietary changes can include:  Changes in what and when you eat.  Eating smaller meals more often.  Eating low-fat foods.  Eating low-fiber forms of high-fiber foods, such as cooked vegetables  instead of raw vegetables.  Having liquid foods in place of solid foods. Liquid foods are easier to digest.  Medicines. These may be given to control nausea and vomiting and to stimulate stomach muscles.  Getting food through a feeding tube. This may be done in severe  cases.  A gastric neurostimulator. This is a device that is inserted into the body with surgery. It helps improve stomach emptying and control nausea and vomiting. Follow these instructions at home:  Follow your health care provider's instructions about exercise and diet.  Take medicines only as directed by your health care provider. Contact a health care provider if:  Your symptoms do not improve with treatment.  You have new symptoms. Get help right away if:  You have severe abdominal pain that does not improve with treatment.  You have nausea that does not go away.  You cannot keep fluids down. This information is not intended to replace advice given to you by your health care provider. Make sure you discuss any questions you have with your health care provider. Document Released: 02/20/2005 Document Revised: 07/29/2015 Document Reviewed: 02/16/2014 Elsevier Interactive Patient Education  2017 [employer]

## 2016-07-12 ENCOUNTER — Encounter: Payer: Self-pay | Admitting: Gastroenterology

## 2016-07-19 ENCOUNTER — Telehealth: Payer: Self-pay

## 2016-07-19 NOTE — Telephone Encounter (Signed)
[facility] does not accept medicaid as insurance for Bariatric Surgery

## 2016-07-19 NOTE — Telephone Encounter (Signed)
The only facility that accepts medicaid for Bariatric Surgery is [facility]. They have a wait of about 3-4 months for medicaid patients. Once they receive the referral, they will send a packet to the patient to fill out and return to them. Once that packet has been returned, the waiting period will begin for the patient.   I have [facility]  Phone #: Efraim KaufmannMelissa [phone] Fax #: [phone] & received a confirmation.   I will follow up within 3-5 days to make sure the appointments have been scheduled.   I have contacted the patient and left her a voicemail with this information.

## 2016-07-24 ENCOUNTER — Encounter: Payer: Self-pay | Admitting: Gastroenterology

## 2016-07-24 ENCOUNTER — Other Ambulatory Visit: Payer: Self-pay

## 2016-07-24 ENCOUNTER — Ambulatory Visit (INDEPENDENT_AMBULATORY_CARE_PROVIDER_SITE_OTHER): Payer: Medicaid Other | Admitting: Gastroenterology

## 2016-07-24 VITALS — BP 142/70 | HR 90 | Temp 98.1°F | Ht 66.0 in | Wt 246.0 lb

## 2016-07-24 DIAGNOSIS — R112 Nausea with vomiting, unspecified: Secondary | ICD-10-CM
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
## 2016-07-25 ENCOUNTER — Telehealth: Payer: Self-pay

## 2016-07-25 NOTE — Telephone Encounter (Signed)
Pt scheduled for a gastric emptying study at [facility], [place] on Friday, June 15th @ 7:30am. I have left a vm with this information along with instructions. Advised pt to contact our office to confirm receipt of this.

## 2016-07-26 NOTE — Discharge Instructions (Signed)

## 2016-07-27 ENCOUNTER — Ambulatory Visit
Admission: RE | Admit: 2016-07-27 | Discharge: 2016-07-27 | Disposition: A | Discharge status: Home / Self Care | Payer: Medicaid Other | Source: Ambulatory Visit | Attending: Gastroenterology | Admitting: Gastroenterology

## 2016-07-27 ENCOUNTER — Encounter
Admission: RE | Disposition: A | Discharge status: Home / Self Care | Payer: Self-pay | Source: Ambulatory Visit | Attending: Gastroenterology

## 2016-07-27 ENCOUNTER — Ambulatory Visit: Payer: Medicaid Other | Admitting: Anesthesiology

## 2016-07-27 DIAGNOSIS — Z7984 Long term (current) use of oral hypoglycemic drugs: Secondary | ICD-10-CM | POA: Diagnosis not present

## 2016-07-27 DIAGNOSIS — R112 Nausea with vomiting, unspecified: Secondary | ICD-10-CM | POA: Diagnosis present

## 2016-07-27 DIAGNOSIS — F419 Anxiety disorder, unspecified: Secondary | ICD-10-CM | POA: Insufficient documentation

## 2016-07-27 DIAGNOSIS — I10 Essential (primary) hypertension: Secondary | ICD-10-CM | POA: Diagnosis not present

## 2016-07-27 DIAGNOSIS — E785 Hyperlipidemia, unspecified: Secondary | ICD-10-CM | POA: Diagnosis not present

## 2016-07-27 DIAGNOSIS — Z87891 Personal history of nicotine dependence: Secondary | ICD-10-CM | POA: Diagnosis not present

## 2016-07-27 DIAGNOSIS — K219 Gastro-esophageal reflux disease without esophagitis: Secondary | ICD-10-CM | POA: Insufficient documentation

## 2016-07-27 DIAGNOSIS — E119 Type 2 diabetes mellitus without complications: Secondary | ICD-10-CM | POA: Diagnosis not present

## 2016-07-27 DIAGNOSIS — K2970 Gastritis, unspecified, without bleeding: Secondary | ICD-10-CM

## 2016-07-27 DIAGNOSIS — F329 Major depressive disorder, single episode, unspecified: Secondary | ICD-10-CM | POA: Diagnosis not present

## 2016-07-27 DIAGNOSIS — Z79899 Other long term (current) drug therapy: Secondary | ICD-10-CM | POA: Insufficient documentation

## 2016-07-27 DIAGNOSIS — K2950 Unspecified chronic gastritis without bleeding: Secondary | ICD-10-CM | POA: Diagnosis not present

## 2016-07-27 DIAGNOSIS — Z6839 Body mass index (BMI) 39.0-39.9, adult: Secondary | ICD-10-CM | POA: Diagnosis not present

## 2016-07-27 HISTORY — DX: Unspecified osteoarthritis, unspecified site: M19.90

## 2016-07-27 HISTORY — PX: ESOPHAGOGASTRODUODENOSCOPY (EGD) WITH PROPOFOL: SHX5813

## 2016-07-27 HISTORY — DX: Gastro-esophageal reflux disease without esophagitis: K21.9

## 2016-07-27 HISTORY — DX: Dyspnea, unspecified: R06.00

## 2016-07-27 LAB — GLUCOSE, CAPILLARY
GLUCOSE-CAPILLARY: 178 mg/dL — AB (ref 65–99)
Glucose-Capillary: 170 mg/dL — ABNORMAL HIGH (ref 65–99)

## 2016-07-27 SURGERY — ESOPHAGOGASTRODUODENOSCOPY (EGD) WITH PROPOFOL
Anesthesia: Monitor Anesthesia Care | Wound class: Clean Contaminated

## 2016-07-27 MED ORDER — PROPOFOL 10 MG/ML IV BOLUS
INTRAVENOUS | Status: DC | PRN
  Administered 2016-07-27: 100 mg via INTRAVENOUS
  Administered 2016-07-27 (×2): 50 mg via INTRAVENOUS

## 2016-07-27 MED ORDER — GLYCOPYRROLATE 0.2 MG/ML IJ SOLN
INTRAMUSCULAR | Status: DC | PRN
  Administered 2016-07-27: 0.2 mg via INTRAVENOUS

## 2016-07-27 MED ORDER — STERILE WATER FOR IRRIGATION IR SOLN
Status: DC | PRN
  Administered 2016-07-27: 11:00:00

## 2016-07-27 MED ORDER — LIDOCAINE HCL (CARDIAC) 20 MG/ML IV SOLN
INTRAVENOUS | Status: DC | PRN
  Administered 2016-07-27: 50 mg via INTRAVENOUS

## 2016-07-27 MED ORDER — LACTATED RINGERS IV SOLN
10.0000 mL/h | INTRAVENOUS | Status: DC
  Administered 2016-07-27: 09:00:00 via INTRAVENOUS

## 2016-07-27 SURGICAL SUPPLY — 32 items

## 2016-07-27 NOTE — Op Note (Signed)
[facility] Gastroenterology Patient Name: Maria Conway Procedure Date: 07/27/2016 10:16 AM MRN: [id number] Account #: [id number] Date of Birth: [birth year] Admit Type: Outpatient Age: 61 [facility] Gender: Female Note Status: Finalized Procedure:            Upper GI endoscopy Indications:          Nausea with vomiting Providers:            Midge Miniumarren Eulogia Dismore MD, MD Referring MD:         Mallory ShirkErin VanScoyoc, MD (Referring MD) Medicines:            Propofol per Anesthesia Complications:        No immediate complications. Procedure:            Pre-Anesthesia Assessment:                       - Prior to the procedure, a History and Physical was                        performed, and patient medications and allergies were                        reviewed. The patient's tolerance of previous                        anesthesia was also reviewed. The risks and benefits of                        the procedure and the sedation options and risks were                        discussed with the patient. All questions were                        answered, and informed consent was obtained. Prior                        Anticoagulants: The patient has taken no previous                        anticoagulant or antiplatelet agents. ASA Grade                        Assessment: II - A patient with mild systemic disease.                        After reviewing the risks and benefits, the patient was                        deemed in satisfactory condition to undergo the                        procedure.                       After obtaining informed consent, the endoscope was                        passed under direct vision. Throughout the procedure,  the patient's blood pressure, pulse, and oxygen                        saturations were monitored continuously. The Olympus                        GIF H180J Endoscope (Z#:[id number]) was introduced through        the mouth, and advanced to the second part of duodenum.                        The upper GI endoscopy was accomplished without                        difficulty. The patient tolerated the procedure well. Findings:      The examined esophagus was normal.      Localized moderate inflammation characterized by erosions was found in       the gastric antrum. Biopsies were taken with a cold forceps for       histology.      The examined duodenum was normal. Impression:           - Normal esophagus.                       - Gastritis. Biopsied.                       - Normal examined duodenum. Recommendation:       - Discharge patient to home.                       - Resume previous diet.                       - Continue present medications.                       - Await pathology results. Procedure Code(s):    --- Professional ---                       [phone], Esophagogastroduodenoscopy, flexible, transoral;                        with biopsy, single or multiple Diagnosis Code(s):    --- Professional ---                       R11.2, Nausea with vomiting, unspecified                       K29.70, Gastritis, unspecified, without bleeding CPT copyright 2016 American Medical Association. All rights reserved. The codes documented in this report are preliminary and upon coder review may  be revised to meet current compliance requirements. Midge Minium MD, MD 07/27/2016 10:37:41 AM This report has been signed electronically. Number of Addenda: 0 Note Initiated On: 07/27/2016 10:16 AM      [facility]

## 2016-07-27 NOTE — Anesthesia Procedure Notes (Signed)
Procedure Name: MAC Performed by: Aristeo Hankerson Pre-anesthesia Checklist: Patient identified, Emergency Drugs available, Suction available, Timeout performed and Patient being monitored Patient Re-evaluated:Patient Re-evaluated prior to inductionOxygen Delivery Method: Nasal cannula Placement Confirmation: positive ETCO2     
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
## 2016-07-27 NOTE — Transfer of Care (Signed)
Immediate Anesthesia Transfer of Care Note  Patient: Maria Conway  Procedure(s) Performed: Procedure(s) with comments: ESOPHAGOGASTRODUODENOSCOPY (EGD) WITH PROPOFOL (N/A) - Diabetic   Patient Location: PACU  Anesthesia Type: MAC  Level of Consciousness: awake, alert  and patient cooperative  Airway and Oxygen Therapy: Patient Spontanous Breathing and Patient connected to supplemental oxygen  Post-op Assessment: Post-op Vital signs reviewed, Patient's Cardiovascular Status Stable, Respiratory Function Stable, Patent Airway and No signs of Nausea or vomiting  Post-op Vital Signs: Reviewed and stable  Complications: No apparent anesthesia complications

## 2016-07-27 NOTE — Anesthesia Preprocedure Evaluation (Signed)
Anesthesia Evaluation  Patient identified by MRN, date of birth, ID band Patient awake    Reviewed: Allergy & Precautions, H&P , NPO status   Airway Mallampati: I  TM Distance: >3 FB     Dental   Missing molars, no loose teeth:   Pulmonary shortness of breath, asthma , former smoker,    breath sounds clear to auscultation       Cardiovascular hypertension,  Rhythm:regular Rate:Normal  hyperlipidemia   Neuro/Psych PSYCHIATRIC DISORDERS (PTSD, depresion, ADHD)    GI/Hepatic GERD  ,  Endo/Other  diabetes, Type 2, Oral Hypoglycemic AgentsMorbid obesity (BMI 40)  Renal/GU      Musculoskeletal   Abdominal   Peds  Hematology   Anesthesia Other Findings Abdomen obese  Reproductive/Obstetrics                             Anesthesia Physical Anesthesia Plan  ASA: III  Anesthesia Plan: MAC   Post-op Pain Management:    Induction:   Airway Management Planned:   Additional Equipment:   Intra-op Plan:   Post-operative Plan:   Informed Consent: I have reviewed the patients History and Physical, chart, labs and discussed the procedure including the risks, benefits and alternatives for the proposed anesthesia with the patient or authorized representative who has indicated his/her understanding and acceptance.   Dental Advisory Given  Plan Discussed with: CRNA  Anesthesia Plan Comments:         Anesthesia Quick Evaluation

## 2016-07-27 NOTE — Anesthesia Postprocedure Evaluation (Signed)
Anesthesia Post Note  Patient: Maria Conway  Procedure(s) Performed: Procedure(s) (LRB): ESOPHAGOGASTRODUODENOSCOPY (EGD) WITH PROPOFOL (N/A)  Patient location during evaluation: PACU Anesthesia Type: MAC Level of consciousness: awake and alert Pain management: pain level controlled Vital Signs Assessment: post-procedure vital signs reviewed and stable Respiratory status: spontaneous breathing, nonlabored ventilation and respiratory function stable Cardiovascular status: stable Postop Assessment: no signs of nausea or vomiting Anesthetic complications: no    Veda Canning

## 2016-07-28 ENCOUNTER — Encounter: Payer: Self-pay | Admitting: Gastroenterology

## 2016-08-01 ENCOUNTER — Encounter: Payer: Self-pay | Admitting: Gastroenterology

## 2016-08-04 ENCOUNTER — Encounter: Payer: Self-pay | Admitting: *Deleted

## 2016-08-18 ENCOUNTER — Ambulatory Visit
Admission: RE | Admit: 2016-08-18 | Discharge: 2016-08-18 | Disposition: A | Discharge status: Home / Self Care | Payer: Medicaid Other | Source: Ambulatory Visit | Attending: Gastroenterology | Admitting: Gastroenterology

## 2016-08-18 DIAGNOSIS — R112 Nausea with vomiting, unspecified: Secondary | ICD-10-CM | POA: Insufficient documentation

## 2016-08-18 MED ORDER — TECHNETIUM TC 99M SULFUR COLLOID
2.0000 | Freq: Once | INTRAVENOUS | Status: AC | PRN
  Administered 2016-08-18: 2.37 via INTRAVENOUS

## 2016-08-24 ENCOUNTER — Telehealth: Payer: Self-pay

## 2016-08-24 NOTE — Telephone Encounter (Signed)
Pt notified of gastric emptying study results.  

## 2016-08-24 NOTE — Telephone Encounter (Signed)
-----   Message from Midge Miniumarren Wohl, MD sent at 08/21/2016  1:22 PM EDT ----- Let the patient know that her gastric emptying showed her stomach empties normally.

## 2016-10-13 ENCOUNTER — Encounter
Admission: EM | Disposition: A | Discharge status: Home / Self Care | Payer: Self-pay | Source: Home / Self Care | Attending: Emergency Medicine

## 2016-10-13 ENCOUNTER — Observation Stay: Payer: Medicaid Other | Admitting: Anesthesiology

## 2016-10-13 ENCOUNTER — Emergency Department: Payer: Medicaid Other

## 2016-10-13 ENCOUNTER — Observation Stay: Payer: Medicaid Other

## 2016-10-13 ENCOUNTER — Observation Stay
Admission: EM | Admit: 2016-10-13 | Discharge: 2016-10-16 | Disposition: A | Discharge status: Home / Self Care | Payer: Medicaid Other | Attending: Surgery | Admitting: Surgery

## 2016-10-13 ENCOUNTER — Encounter: Payer: Self-pay | Admitting: Emergency Medicine

## 2016-10-13 DIAGNOSIS — I10 Essential (primary) hypertension: Secondary | ICD-10-CM | POA: Insufficient documentation

## 2016-10-13 DIAGNOSIS — K219 Gastro-esophageal reflux disease without esophagitis: Secondary | ICD-10-CM | POA: Insufficient documentation

## 2016-10-13 DIAGNOSIS — R1011 Right upper quadrant pain: Secondary | ICD-10-CM | POA: Diagnosis present

## 2016-10-13 DIAGNOSIS — M19019 Primary osteoarthritis, unspecified shoulder: Secondary | ICD-10-CM | POA: Diagnosis not present

## 2016-10-13 DIAGNOSIS — Z7984 Long term (current) use of oral hypoglycemic drugs: Secondary | ICD-10-CM | POA: Insufficient documentation

## 2016-10-13 DIAGNOSIS — E119 Type 2 diabetes mellitus without complications: Secondary | ICD-10-CM | POA: Diagnosis not present

## 2016-10-13 DIAGNOSIS — Z818 Family history of other mental and behavioral disorders: Secondary | ICD-10-CM | POA: Diagnosis not present

## 2016-10-13 DIAGNOSIS — Z888 Allergy status to other drugs, medicaments and biological substances status: Secondary | ICD-10-CM | POA: Diagnosis not present

## 2016-10-13 DIAGNOSIS — Z809 Family history of malignant neoplasm, unspecified: Secondary | ICD-10-CM | POA: Diagnosis not present

## 2016-10-13 DIAGNOSIS — F4310 Post-traumatic stress disorder, unspecified: Secondary | ICD-10-CM | POA: Diagnosis not present

## 2016-10-13 DIAGNOSIS — F329 Major depressive disorder, single episode, unspecified: Secondary | ICD-10-CM | POA: Insufficient documentation

## 2016-10-13 DIAGNOSIS — K8000 Calculus of gallbladder with acute cholecystitis without obstruction: Secondary | ICD-10-CM | POA: Diagnosis not present

## 2016-10-13 DIAGNOSIS — K810 Acute cholecystitis: Secondary | ICD-10-CM | POA: Diagnosis not present

## 2016-10-13 DIAGNOSIS — K819 Cholecystitis, unspecified: Secondary | ICD-10-CM

## 2016-10-13 DIAGNOSIS — Z87891 Personal history of nicotine dependence: Secondary | ICD-10-CM | POA: Insufficient documentation

## 2016-10-13 DIAGNOSIS — Z79899 Other long term (current) drug therapy: Secondary | ICD-10-CM | POA: Insufficient documentation

## 2016-10-13 DIAGNOSIS — Z419 Encounter for procedure for purposes other than remedying health state, unspecified: Secondary | ICD-10-CM

## 2016-10-13 DIAGNOSIS — G8929 Other chronic pain: Secondary | ICD-10-CM | POA: Insufficient documentation

## 2016-10-13 DIAGNOSIS — R112 Nausea with vomiting, unspecified: Secondary | ICD-10-CM | POA: Diagnosis not present

## 2016-10-13 DIAGNOSIS — Z811 Family history of alcohol abuse and dependence: Secondary | ICD-10-CM | POA: Insufficient documentation

## 2016-10-13 HISTORY — PX: CHOLECYSTECTOMY: SHX55

## 2016-10-13 LAB — COMPREHENSIVE METABOLIC PANEL
ALT: 63 U/L — ABNORMAL HIGH (ref 14–54)
ANION GAP: 10 (ref 5–15)
AST: 63 U/L — ABNORMAL HIGH (ref 15–41)
Albumin: 4.2 g/dL (ref 3.5–5.0)
Alkaline Phosphatase: 254 U/L — ABNORMAL HIGH (ref 38–126)
BUN: 12 mg/dL (ref 6–20)
CO2: 27 mmol/L (ref 22–32)
Calcium: 9.4 mg/dL (ref 8.9–10.3)
Chloride: 103 mmol/L (ref 101–111)
Creatinine, Ser: 0.89 mg/dL (ref 0.44–1.00)
GFR calc non Af Amer: >60 mL/min (ref >60)
GLUCOSE: 230 mg/dL — AB (ref 65–99)
POTASSIUM: 3.6 mmol/L (ref 3.5–5.1)
Sodium: 140 mmol/L (ref 135–145)
TOTAL PROTEIN: 7.9 g/dL (ref 6.5–8.1)
Total Bilirubin: 0.7 mg/dL (ref 0.3–1.2)

## 2016-10-13 LAB — CBC
HEMATOCRIT: 37.2 % (ref 35.0–47.0)
HEMATOCRIT: 36.5 % (ref 35.0–47.0)
HEMOGLOBIN: 12.5 g/dL (ref 12.0–16.0)
HEMOGLOBIN: 12.4 g/dL (ref 12.0–16.0)
MCH: 26.9 pg (ref 26.0–34.0)
MCH: 27.4 pg (ref 26.0–34.0)
MCHC: 33.5 g/dL (ref 32.0–36.0)
MCHC: 34.1 g/dL (ref 32.0–36.0)
MCV: 80.1 fL (ref 80.0–100.0)
MCV: 80.5 fL (ref 80.0–100.0)
Platelets: 243 10*3/uL (ref 150–440)
Platelets: 210 10*3/uL (ref 150–440)
RBC: 4.64 MIL/uL (ref 3.80–5.20)
RBC: 4.53 MIL/uL (ref 3.80–5.20)
RDW: 14.9 % — AB (ref 11.5–14.5)
RDW: 15 % — ABNORMAL HIGH (ref 11.5–14.5)
WBC: 8 10*3/uL (ref 3.6–11.0)
WBC: 6.5 10*3/uL (ref 3.6–11.0)

## 2016-10-13 LAB — CREATININE, SERUM
Creatinine, Ser: 0.82 mg/dL (ref 0.44–1.00)
GFR calc non Af Amer: >60 mL/min (ref >60)

## 2016-10-13 LAB — URINALYSIS, COMPLETE (UACMP) WITH MICROSCOPIC
BACTERIA UA: NONE SEEN
Bilirubin Urine: NEGATIVE
Glucose, UA: NEGATIVE mg/dL
Hgb urine dipstick: NEGATIVE
Ketones, ur: NEGATIVE mg/dL
Leukocytes, UA: NEGATIVE
Nitrite: NEGATIVE
PH: 7 (ref 5.0–8.0)
Protein, ur: 30 mg/dL — AB
SPECIFIC GRAVITY, URINE: 1.015 (ref 1.005–1.030)

## 2016-10-13 LAB — SURGICAL PCR SCREEN
MRSA, PCR: NEGATIVE
STAPHYLOCOCCUS AUREUS: POSITIVE — AB

## 2016-10-13 LAB — GLUCOSE, CAPILLARY
GLUCOSE-CAPILLARY: 234 mg/dL — AB (ref 65–99)
Glucose-Capillary: 234 mg/dL — ABNORMAL HIGH (ref 65–99)

## 2016-10-13 LAB — LIPASE, BLOOD: LIPASE: 42 U/L (ref 11–51)

## 2016-10-13 SURGERY — LAPAROSCOPIC CHOLECYSTECTOMY WITH INTRAOPERATIVE CHOLANGIOGRAM
Anesthesia: General | Site: Abdomen | Wound class: Clean Contaminated

## 2016-10-13 MED ORDER — KETOROLAC TROMETHAMINE 30 MG/ML IJ SOLN
30.0000 mg | Freq: Four times a day (QID) | INTRAMUSCULAR | Status: DC
  Administered 2016-10-13 – 2016-10-16 (×12): 30 mg via INTRAVENOUS
  Filled 2016-10-13 (×13): qty 1

## 2016-10-13 MED ORDER — ONDANSETRON 4 MG PO TBDP
ORAL_TABLET | ORAL | Status: AC
  Administered 2016-10-13: 4 mg via ORAL
  Filled 2016-10-13: qty 1

## 2016-10-13 MED ORDER — ONDANSETRON HCL 4 MG/2ML IJ SOLN
INTRAMUSCULAR | Status: AC
  Filled 2016-10-13: qty 2

## 2016-10-13 MED ORDER — SUCCINYLCHOLINE CHLORIDE 20 MG/ML IJ SOLN
INTRAMUSCULAR | Status: DC | PRN
  Administered 2016-10-13: 100 mg via INTRAVENOUS

## 2016-10-13 MED ORDER — CEFAZOLIN SODIUM-DEXTROSE 1-4 GM/50ML-% IV SOLN
INTRAVENOUS | Status: AC
  Administered 2016-10-13: 1 g via INTRAVENOUS
  Filled 2016-10-13: qty 50

## 2016-10-13 MED ORDER — PROPOFOL 10 MG/ML IV BOLUS
INTRAVENOUS | Status: DC | PRN
  Administered 2016-10-13: 100 mg via INTRAVENOUS

## 2016-10-13 MED ORDER — HYDROMORPHONE HCL 1 MG/ML IJ SOLN
1.0000 mg | Freq: Once | INTRAMUSCULAR | Status: AC
  Administered 2016-10-13: 1 mg via INTRAVENOUS
  Filled 2016-10-13: qty 1

## 2016-10-13 MED ORDER — ROCURONIUM BROMIDE 50 MG/5ML IV SOLN
INTRAVENOUS | Status: AC
Start: 2016-10-13 — End: ?
  Filled 2016-10-13: qty 1

## 2016-10-13 MED ORDER — BUDESONIDE 0.25 MG/2ML IN SUSP
0.2500 mg | Freq: Two times a day (BID) | RESPIRATORY_TRACT | Status: DC
  Administered 2016-10-14 – 2016-10-16 (×5): 0.25 mg via RESPIRATORY_TRACT
  Filled 2016-10-13 (×6): qty 2

## 2016-10-13 MED ORDER — FENTANYL CITRATE (PF) 100 MCG/2ML IJ SOLN
INTRAMUSCULAR | Status: DC | PRN
  Administered 2016-10-13 (×2): 50 ug via INTRAVENOUS

## 2016-10-13 MED ORDER — BACLOFEN 10 MG PO TABS
5.0000 mg | ORAL_TABLET | Freq: Three times a day (TID) | ORAL | Status: DC
  Administered 2016-10-13 – 2016-10-16 (×9): 5 mg via ORAL
  Filled 2016-10-13 (×13): qty 0.5

## 2016-10-13 MED ORDER — ONDANSETRON 4 MG PO TBDP
4.0000 mg | ORAL_TABLET | Freq: Once | ORAL | Status: AC | PRN
  Administered 2016-10-13: 4 mg via ORAL

## 2016-10-13 MED ORDER — CHLORHEXIDINE GLUCONATE CLOTH 2 % EX PADS
6.0000 | MEDICATED_PAD | Freq: Every day | CUTANEOUS | Status: DC
  Administered 2016-10-14 – 2016-10-16 (×3): 6 via TOPICAL

## 2016-10-13 MED ORDER — ROCURONIUM BROMIDE 50 MG/5ML IV SOLN
INTRAVENOUS | Status: AC
  Filled 2016-10-13: qty 1

## 2016-10-13 MED ORDER — EPHEDRINE SULFATE 50 MG/ML IJ SOLN
INTRAMUSCULAR | Status: AC
  Filled 2016-10-13: qty 1

## 2016-10-13 MED ORDER — OXYCODONE HCL 5 MG PO TABS: 5.0000 mg | ORAL_TABLET | Freq: Once | ORAL | Status: DC | PRN

## 2016-10-13 MED ORDER — HYDROMORPHONE HCL 1 MG/ML IJ SOLN
0.5000 mg | Freq: Once | INTRAMUSCULAR | Status: AC
  Administered 2016-10-13: 0.5 mg via INTRAVENOUS
  Filled 2016-10-13: qty 1

## 2016-10-13 MED ORDER — ONDANSETRON HCL 4 MG/2ML IJ SOLN
4.0000 mg | Freq: Once | INTRAMUSCULAR | Status: AC
  Administered 2016-10-13: 4 mg via INTRAVENOUS
  Filled 2016-10-13: qty 2

## 2016-10-13 MED ORDER — FENTANYL CITRATE (PF) 100 MCG/2ML IJ SOLN
INTRAMUSCULAR | Status: AC
  Administered 2016-10-13: 50 ug via INTRAVENOUS
  Filled 2016-10-13: qty 2

## 2016-10-13 MED ORDER — ACETAMINOPHEN 10 MG/ML IV SOLN
INTRAVENOUS | Status: DC | PRN
  Administered 2016-10-13: 1000 mg via INTRAVENOUS

## 2016-10-13 MED ORDER — CEFAZOLIN SODIUM-DEXTROSE 1-4 GM/50ML-% IV SOLN
1.0000 g | Freq: Four times a day (QID) | INTRAVENOUS | Status: DC
  Administered 2016-10-13 – 2016-10-14 (×4): 1 g via INTRAVENOUS
  Filled 2016-10-13 (×9): qty 50

## 2016-10-13 MED ORDER — KETOROLAC TROMETHAMINE 30 MG/ML IJ SOLN
INTRAMUSCULAR | Status: DC | PRN
  Administered 2016-10-13: 30 mg via INTRAVENOUS

## 2016-10-13 MED ORDER — ONDANSETRON HCL 4 MG/2ML IJ SOLN: 4.0000 mg | Freq: Four times a day (QID) | INTRAMUSCULAR | Status: DC | PRN

## 2016-10-13 MED ORDER — DEXAMETHASONE SODIUM PHOSPHATE 10 MG/ML IJ SOLN
INTRAMUSCULAR | Status: DC | PRN
  Administered 2016-10-13: 5 mg via INTRAVENOUS

## 2016-10-13 MED ORDER — BUPIVACAINE-EPINEPHRINE 0.25% -1:200000 IJ SOLN
INTRAMUSCULAR | Status: DC | PRN
  Administered 2016-10-13: 30 mL

## 2016-10-13 MED ORDER — SUGAMMADEX SODIUM 200 MG/2ML IV SOLN
INTRAVENOUS | Status: DC | PRN
  Administered 2016-10-13: 225 mg via INTRAVENOUS

## 2016-10-13 MED ORDER — SUGAMMADEX SODIUM 500 MG/5ML IV SOLN
INTRAVENOUS | Status: AC
Start: 2016-10-13 — End: ?
  Filled 2016-10-13: qty 5

## 2016-10-13 MED ORDER — METFORMIN HCL ER 500 MG PO TB24
2000.0000 mg | ORAL_TABLET | Freq: Every day | ORAL | Status: DC
  Administered 2016-10-13 – 2016-10-16 (×4): 2000 mg via ORAL
  Filled 2016-10-13 (×4): qty 4

## 2016-10-13 MED ORDER — LIDOCAINE HCL (CARDIAC) 20 MG/ML IV SOLN
INTRAVENOUS | Status: DC | PRN
  Administered 2016-10-13: 50 mg via INTRAVENOUS

## 2016-10-13 MED ORDER — GLYCOPYRROLATE 0.2 MG/ML IJ SOLN
INTRAMUSCULAR | Status: DC | PRN
  Administered 2016-10-13: 0.2 mg via INTRAVENOUS

## 2016-10-13 MED ORDER — PHENYLEPHRINE HCL 10 MG/ML IJ SOLN
INTRAMUSCULAR | Status: AC
  Filled 2016-10-13: qty 1

## 2016-10-13 MED ORDER — HYDROMORPHONE HCL 1 MG/ML IJ SOLN
0.5000 mg | INTRAMUSCULAR | Status: DC | PRN
  Administered 2016-10-13 – 2016-10-14 (×6): 0.5 mg via INTRAVENOUS
  Filled 2016-10-13 (×6): qty 0.5

## 2016-10-13 MED ORDER — MIDAZOLAM HCL 2 MG/2ML IJ SOLN
INTRAMUSCULAR | Status: AC
  Filled 2016-10-13: qty 2

## 2016-10-13 MED ORDER — BUPROPION HCL ER (XL) 150 MG PO TB24
150.0000 mg | ORAL_TABLET | Freq: Every day | ORAL | Status: DC
  Administered 2016-10-13 – 2016-10-16 (×4): 150 mg via ORAL
  Filled 2016-10-13 (×4): qty 1

## 2016-10-13 MED ORDER — GABAPENTIN 600 MG PO TABS
600.0000 mg | ORAL_TABLET | Freq: Three times a day (TID) | ORAL | Status: DC
  Administered 2016-10-13: 600 mg via ORAL
  Filled 2016-10-13: qty 1

## 2016-10-13 MED ORDER — ONDANSETRON HCL 4 MG PO TABS: 4.0000 mg | ORAL_TABLET | Freq: Four times a day (QID) | ORAL | Status: DC | PRN

## 2016-10-13 MED ORDER — ONDANSETRON HCL 4 MG/2ML IJ SOLN
INTRAMUSCULAR | Status: DC | PRN
  Administered 2016-10-13: 4 mg via INTRAVENOUS

## 2016-10-13 MED ORDER — EPHEDRINE SULFATE 50 MG/ML IJ SOLN
INTRAMUSCULAR | Status: DC | PRN
  Administered 2016-10-13: 10 mg via INTRAVENOUS

## 2016-10-13 MED ORDER — FENTANYL CITRATE (PF) 100 MCG/2ML IJ SOLN
INTRAMUSCULAR | Status: AC
  Administered 2016-10-13: 50 ug via NASAL
  Filled 2016-10-13: qty 2

## 2016-10-13 MED ORDER — DEXAMETHASONE SODIUM PHOSPHATE 10 MG/ML IJ SOLN
INTRAMUSCULAR | Status: AC
  Filled 2016-10-13: qty 1

## 2016-10-13 MED ORDER — LIDOCAINE HCL (PF) 2 % IJ SOLN
INTRAMUSCULAR | Status: AC
  Filled 2016-10-13: qty 2

## 2016-10-13 MED ORDER — SODIUM CHLORIDE 0.9 % IV SOLN
INTRAVENOUS | Status: DC | PRN
  Administered 2016-10-13 (×2): via INTRAVENOUS

## 2016-10-13 MED ORDER — MIDAZOLAM HCL 2 MG/2ML IJ SOLN
INTRAMUSCULAR | Status: DC | PRN
  Administered 2016-10-13: 2 mg via INTRAVENOUS

## 2016-10-13 MED ORDER — SODIUM CHLORIDE 0.9 % IV BOLUS (SEPSIS)
1000.0000 mL | Freq: Once | INTRAVENOUS | Status: AC
  Administered 2016-10-13: 1000 mL via INTRAVENOUS

## 2016-10-13 MED ORDER — ROCURONIUM BROMIDE 100 MG/10ML IV SOLN
INTRAVENOUS | Status: DC | PRN
  Administered 2016-10-13: 10 mg via INTRAVENOUS
  Administered 2016-10-13: 40 mg via INTRAVENOUS

## 2016-10-13 MED ORDER — KETOROLAC TROMETHAMINE 30 MG/ML IJ SOLN
INTRAMUSCULAR | Status: AC
  Filled 2016-10-13: qty 1

## 2016-10-13 MED ORDER — ALPRAZOLAM 0.5 MG PO TABS
0.5000 mg | ORAL_TABLET | Freq: Three times a day (TID) | ORAL | Status: DC | PRN
  Administered 2016-10-13 – 2016-10-15 (×4): 0.5 mg via ORAL
  Filled 2016-10-13 (×4): qty 1

## 2016-10-13 MED ORDER — LACTATED RINGERS IV SOLN
INTRAVENOUS | Status: DC
  Administered 2016-10-13 – 2016-10-14 (×3): via INTRAVENOUS

## 2016-10-13 MED ORDER — OXYCODONE HCL 5 MG/5ML PO SOLN: 5.0000 mg | Freq: Once | ORAL | Status: DC | PRN

## 2016-10-13 MED ORDER — FENTANYL CITRATE (PF) 100 MCG/2ML IJ SOLN
INTRAMUSCULAR | Status: AC
  Filled 2016-10-13: qty 2

## 2016-10-13 MED ORDER — HEPARIN SODIUM (PORCINE) 5000 UNIT/ML IJ SOLN
5000.0000 [IU] | Freq: Three times a day (TID) | INTRAMUSCULAR | Status: DC
  Administered 2016-10-13 – 2016-10-16 (×9): 5000 [IU] via SUBCUTANEOUS
  Filled 2016-10-13 (×8): qty 1

## 2016-10-13 MED ORDER — MUPIROCIN 2 % EX OINT
1.0000 "application " | TOPICAL_OINTMENT | Freq: Two times a day (BID) | CUTANEOUS | Status: DC
  Administered 2016-10-13 – 2016-10-16 (×7): 1 via NASAL
  Filled 2016-10-13: qty 22

## 2016-10-13 MED ORDER — GABAPENTIN 400 MG PO CAPS
800.0000 mg | ORAL_CAPSULE | Freq: Three times a day (TID) | ORAL | Status: DC
  Administered 2016-10-13 – 2016-10-16 (×8): 800 mg via ORAL
  Filled 2016-10-13 (×8): qty 2

## 2016-10-13 MED ORDER — FENTANYL CITRATE (PF) 100 MCG/2ML IJ SOLN
50.0000 ug | INTRAMUSCULAR | Status: DC | PRN
Start: 2016-10-13 — End: 2016-10-13
  Administered 2016-10-13: 50 ug via NASAL

## 2016-10-13 MED ORDER — TROLAMINE SALICYLATE 10 % EX CREA
TOPICAL_CREAM | Freq: Two times a day (BID) | CUTANEOUS | Status: DC | PRN
  Administered 2016-10-15 – 2016-10-16 (×2): via TOPICAL
  Filled 2016-10-13: qty 85

## 2016-10-13 MED ORDER — FENTANYL CITRATE (PF) 100 MCG/2ML IJ SOLN
25.0000 ug | INTRAMUSCULAR | Status: DC | PRN
  Administered 2016-10-13 (×2): 50 ug via INTRAVENOUS

## 2016-10-13 MED ORDER — OXYCODONE-ACETAMINOPHEN 7.5-325 MG PO TABS
2.0000 | ORAL_TABLET | ORAL | Status: DC | PRN
  Administered 2016-10-13 – 2016-10-16 (×8): 2 via ORAL
  Filled 2016-10-13 (×8): qty 2

## 2016-10-13 MED ORDER — ACETAMINOPHEN 10 MG/ML IV SOLN
INTRAVENOUS | Status: AC
  Filled 2016-10-13: qty 100

## 2016-10-13 MED ORDER — ALBUTEROL SULFATE (2.5 MG/3ML) 0.083% IN NEBU: 3.0000 mL | INHALATION_SOLUTION | RESPIRATORY_TRACT | Status: DC | PRN

## 2016-10-13 MED ORDER — GLYCOPYRROLATE 0.2 MG/ML IJ SOLN
INTRAMUSCULAR | Status: AC
Start: 2016-10-13 — End: ?
  Filled 2016-10-13: qty 1

## 2016-10-13 MED ORDER — SUCCINYLCHOLINE CHLORIDE 20 MG/ML IJ SOLN
INTRAMUSCULAR | Status: AC
  Filled 2016-10-13: qty 1

## 2016-10-13 MED ORDER — SERTRALINE HCL 50 MG PO TABS
250.0000 mg | ORAL_TABLET | Freq: Every day | ORAL | Status: DC
  Administered 2016-10-13 – 2016-10-16 (×4): 250 mg via ORAL
  Filled 2016-10-13 (×4): qty 5

## 2016-10-13 MED ORDER — PROPOFOL 10 MG/ML IV BOLUS
INTRAVENOUS | Status: AC
  Filled 2016-10-13: qty 20

## 2016-10-13 MED ORDER — PHENYLEPHRINE HCL 10 MG/ML IJ SOLN
INTRAMUSCULAR | Status: DC | PRN
  Administered 2016-10-13: 200 ug via INTRAVENOUS
  Administered 2016-10-13: 100 ug via INTRAVENOUS
  Administered 2016-10-13: 200 ug via INTRAVENOUS
  Administered 2016-10-13: 100 ug via INTRAVENOUS
  Administered 2016-10-13: 200 ug via INTRAVENOUS

## 2016-10-13 SURGICAL SUPPLY — 51 items
APPLICATOR COTTON TIP 6IN STRL (MISCELLANEOUS) ×3 IMPLANT
APPLIER CLIP 5 13 M/L LIGAMAX5 (MISCELLANEOUS) ×3
BLADE SURG 15 STRL LF DISP TIS (BLADE) ×1 IMPLANT
BLADE SURG 15 STRL SS (BLADE) ×2
BULB RESERV EVAC DRAIN JP 100C (MISCELLANEOUS) ×3 IMPLANT
CANISTER SUCT 1200ML W/VALVE (MISCELLANEOUS) ×3 IMPLANT
CHLORAPREP W/TINT 26ML (MISCELLANEOUS) ×3 IMPLANT
CHOLANGIOGRAM CATH TAUT (CATHETERS) IMPLANT
CLEANER CAUTERY TIP 5X5 PAD (MISCELLANEOUS) ×1 IMPLANT
CLIP APPLIE 5 13 M/L LIGAMAX5 (MISCELLANEOUS) ×1 IMPLANT
DECANTER SPIKE VIAL GLASS SM (MISCELLANEOUS) ×6 IMPLANT
DERMABOND ADVANCED (GAUZE/BANDAGES/DRESSINGS) ×2
DERMABOND ADVANCED .7 DNX12 (GAUZE/BANDAGES/DRESSINGS) ×1 IMPLANT
DRAIN CHANNEL JP 19F (MISCELLANEOUS) ×3 IMPLANT
DRAPE C-ARM XRAY 36X54 (DRAPES) ×3 IMPLANT
ELECT CAUTERY BLADE 6.4 (BLADE) ×3 IMPLANT
ELECT REM PT RETURN 9FT ADLT (ELECTROSURGICAL) ×3
ELECTRODE REM PT RTRN 9FT ADLT (ELECTROSURGICAL) ×1 IMPLANT
ENDOLOOP SUT PDS II  0 18 (SUTURE) ×2
ENDOLOOP SUT PDS II 0 18 (SUTURE) ×1 IMPLANT
GLOVE BIO SURGEON STRL SZ7 (GLOVE) ×3 IMPLANT
GOWN STRL REUS W/ TWL LRG LVL3 (GOWN DISPOSABLE) ×3 IMPLANT
GOWN STRL REUS W/TWL LRG LVL3 (GOWN DISPOSABLE) ×6
IRRIGATION STRYKERFLOW (MISCELLANEOUS) ×1 IMPLANT
IRRIGATOR STRYKERFLOW (MISCELLANEOUS) ×3
IV CATH ANGIO 12GX3 LT BLUE (NEEDLE) ×3 IMPLANT
IV NS 1000ML (IV SOLUTION) ×2
IV NS 1000ML BAXH (IV SOLUTION) ×1 IMPLANT
L-HOOK LAP DISP 36CM (ELECTROSURGICAL) ×3
LHOOK LAP DISP 36CM (ELECTROSURGICAL) ×1 IMPLANT
NEEDLE HYPO 22GX1.5 SAFETY (NEEDLE) ×3 IMPLANT
PACK LAP CHOLECYSTECTOMY (MISCELLANEOUS) ×3 IMPLANT
PAD CLEANER CAUTERY TIP 5X5 (MISCELLANEOUS) ×2
PENCIL ELECTRO HAND CTR (MISCELLANEOUS) ×3 IMPLANT
POUCH SPECIMEN RETRIEVAL 10MM (ENDOMECHANICALS) ×3 IMPLANT
SCISSORS METZENBAUM CVD 33 (INSTRUMENTS) ×3 IMPLANT
SLEEVE ENDOPATH XCEL 5M (ENDOMECHANICALS) ×6 IMPLANT
SOL ANTI-FOG 6CC FOG-OUT (MISCELLANEOUS) ×1 IMPLANT
SOL FOG-OUT ANTI-FOG 6CC (MISCELLANEOUS) ×2
SPONGE DRAIN TRACH 4X4 STRL 2S (GAUZE/BANDAGES/DRESSINGS) ×3 IMPLANT
SPONGE LAP 18X18 5 PK (GAUZE/BANDAGES/DRESSINGS) ×3 IMPLANT
STOPCOCK 4 WAY LG BORE MALE ST (IV SETS) IMPLANT
SUT ETHIBOND 0 MO6 C/R (SUTURE) IMPLANT
SUT MNCRL AB 4-0 PS2 18 (SUTURE) ×6 IMPLANT
SUT VICRYL 0 AB UR-6 (SUTURE) ×6 IMPLANT
SYR 20CC LL (SYRINGE) ×3 IMPLANT
TROCAR XCEL BLUNT TIP 100MML (ENDOMECHANICALS) ×3 IMPLANT
TROCAR XCEL NON-BLD 5MMX100MML (ENDOMECHANICALS) ×3 IMPLANT
TUBING INSUF HEATED (TUBING) ×3 IMPLANT
TUBING INSUFFLATOR HI FLOW (MISCELLANEOUS) IMPLANT
WATER STERILE IRR 1000ML POUR (IV SOLUTION) ×3 IMPLANT

## 2016-10-13 NOTE — Progress Notes (Signed)
Patient is crying due to pain.  No pain med due at this time. Dr Everlene FarrierPabon gave an order for dilaudid 1mg 

## 2016-10-13 NOTE — Progress Notes (Signed)
Preoperative Review   Patient is met in the preoperatively. The history is reviewed in the chart and with the patient. I personally reviewed the options and rationale as well as the risks of this procedure that have been previously discussed with the patient. All questions asked by the patient and/or family were answered to their satisfaction.  Patient agrees to proceed with this procedure at this time. Plan for lap chole w IOC  Caroleen Hamman M.D. FACS

## 2016-10-13 NOTE — Op Note (Signed)
Laparoscopic Cholecystectomy  Pre-operative Diagnosis: Acute cholecystitis  Post-operative Diagnosis: Gangrenous cholecystitis  Procedure: Laparoscopic cholecystectomy  Surgeon: Sterling Bigiego Teniya Filter, MD FACS  Anesthesia: Gen. with endotracheal tube  Findings: Gangrenous Cholecystitis   Estimated Blood Loss: 150cc         Drains: [address]         Specimens: Gallbladder           Complications: none   Procedure Details  The patient was seen again in the Holding Room. The benefits, complications, treatment options, and expected outcomes were discussed with the patient. The risks of bleeding, infection, recurrence of symptoms, failure to resolve symptoms, bile duct damage, bile duct leak, retained common bile duct stone, bowel injury, any of which could require further surgery and/or ERCP, stent, or papillotomy were reviewed with the patient. The likelihood of improving the patient's symptoms with return to their baseline status is good.  The patient and/or family concurred with the proposed plan, giving informed consent.  The patient was taken to Operating Room, identified as Ilean ChinaAlycia R Spivack and the procedure verified as Laparoscopic Cholecystectomy.  A Time Out was held and the above information confirmed.  Prior to the induction of general anesthesia, antibiotic prophylaxis was administered. VTE prophylaxis was in place. General endotracheal anesthesia was then administered and tolerated well. After the induction, the abdomen was prepped with Chloraprep and draped in the sterile fashion. The patient was positioned in the supine position.  Cut down technique was used to enter the abdominal cavity and a Hasson trochar was placed after two vicryl stitches were anchored to the fascia. Pneumoperitoneum was then created with CO2 and tolerated well without any adverse changes in the patient's vital signs.  Three 5-mm ports were placed in the right upper quadrant all under direct vision. All skin  incisions  were infiltrated with a local anesthetic agent before making the incision and placing the trocars.   The patient was positioned  in reverse Trendelenburg, tilted slightly to the patient's left.  The gallbladder was identified, the fundus grasped and retracted cephalad.  Her significant adhesions from the omentum to the gallbladder as well as from the duodenum to the gallbladder. These adhesions were lysed very careful with a combination of scissors and electrocautery. There was significant inflammatory response around the infundibulum of the gallbladder and the cystic duct junction.   Adhesions were lysed bluntly. The infundibulum was grasped and retracted laterally, exposing the peritoneum overlying the triangle of Calot. This was then divided and exposed in a blunt fashion.  There was significant inflammatory response around the cystic duct and infundibulum. I felt that doing a dome down technique will allow us to get better exposure. This point the gallbladder was taken off the liver bed and dome down with electrocautery.    An extended critical view of the cystic duct and cystic artery was obtained.  The cystic duct was clearly identified and bluntly dissected. A ductotomy was performed and there was some small stones that where seen within the cystic duct. I was able to milk some of the stones back. And unfortunately I was able to cannulize the cystic duct after multiple tries to place a cholangiogram. There was some stricture and and I was unable to safely perform a cholangiogram.  He is noted that while we were attended to the glandular the patient is laid off the table did not fall but we had to resecured to the bed. There was no evidence of any injuries. We stayed sterile  throughout the case and were able to secure the patient to the bed and placed a new camera at the Contaminated during this episode. We also ran out of CO2 gas and took [place] some time to get a new cylinder. After  all these logistics issues were resolved we went back with the laparoscope and were able to identify the cystic duct and cystic artery. The cystic artery was double clipped and ligated and the cystic duct was generous and I cited to place an Endoloop in the standard fashion.  The gallbladder was removed and placed in an Endocatch bag. The liver bed was irrigated and inspected. Hemostasis was achieved with the electrocautery. Copious irrigation was utilized and was repeatedly aspirated until clear.  The gallbladder and Endocatch sac were then removed through a port site.    Inspection of the right upper quadrant was performed. No bleeding, bile duct injury or leak, or bowel injury was noted. A #19 Blake drain was placed in the right upper quadrant within the gallbladder fossa and secured to skin using 3-0 nylons   Pneumoperitoneum was released.  The periumbilical port site was closed with interrumpted 0 Vicryl sutures. 4-0 subcuticular Monocryl was used to close the skin. Dermabond was  applied.  The patient was then extubated and brought to the recovery room in stable condition. Sponge, lap, and needle counts were correct at closure and at the conclusion of the case.               Sterling Big, MD, FACS

## 2016-10-13 NOTE — Anesthesia Postprocedure Evaluation (Signed)
Anesthesia Post Note  Patient: Maria Conway  Procedure(s) Performed: Procedure(s) (LRB): LAPAROSCOPIC CHOLECYSTECTOMY WITH INTRAOPERATIVE CHOLANGIOGRAM (N/A)  Patient location during evaluation: PACU Anesthesia Type: General Level of consciousness: awake and alert Pain management: pain level controlled Vital Signs Assessment: post-procedure vital signs reviewed and stable Respiratory status: spontaneous breathing, nonlabored ventilation, respiratory function stable and patient connected to nasal cannula oxygen Cardiovascular status: blood pressure returned to baseline and stable Postop Assessment: no signs of nausea or vomiting Anesthetic complications: no     Last Vitals:  Vitals:   10/13/16 1335 10/13/16 1338  BP: (!) 149/69   Pulse: 84   Resp: 20   Temp: 36.5 C   SpO2: (!) 88% 100%    Last Pain:  Vitals:   10/13/16 1341  TempSrc:   PainSc: Asleep                 Cleda MccreedyJoseph K Sitlali Koerner
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
## 2016-10-13 NOTE — Progress Notes (Signed)
Patient returned from pacu.  She was sleeping when she was brought into the room but then she woke up moaning then easily went back to sleep

## 2016-10-13 NOTE — Progress Notes (Signed)
Inpatient Diabetes Program Recommendations  AACE/ADA: New Consensus Statement on Inpatient Glycemic Control (2015)  Target Ranges:  Prepandial:   less than 140 mg/dL      Peak postprandial:   less than 180 mg/dL (1-2 hours)      Critically ill patients:  140 - 180 mg/dL  Results for Maria Conway, Maria Conway (MRN [id number]) as of 10/13/2016 11:58  Ref. Range 10/13/2016 09:05  Glucose-Capillary Latest Ref Range: 65 - 99 mg/dL 782234 (H)  Results for Maria Conway, Maria Conway (MRN [id number]) as of 10/13/2016 11:58  Ref. Range 10/13/2016 02:44  Glucose Latest Ref Range: 65 - 99 mg/dL 578230 (H)    Review of Glycemic Control  Diabetes history: DM2 Outpatient Diabetes medications: Metformin XR 2000 mg daily, Byetta 10 mcg BID Current orders for Inpatient glycemic control: None  Inpatient Diabetes Program Recommendations:  Correction (SSI): While inpatient, please consider ordering CBGs with Novolog correction scale ACHS. HgbA1C: Please consider ordering an A1C to evaluate glycemic control over the past 2-3 months.  Thanks, Orlando PennerMarie Tanyon Alipio, RN, MSN, CDE Diabetes Coordinator Inpatient Diabetes Program [phone] (Team Pager from 8am to 5pm)
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
## 2016-10-13 NOTE — Progress Notes (Signed)
CH made initial visit with Pt who was very emotional and sad. Pt had a procedure this morning and is recovering. 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 She feels, however, like Job, in that she has had many trials. CH provided empathetic listening and prayer. Pt seemed calmer at the end of our visit. CH is available for follow up as needed.    10/13/16 1500  Clinical Encounter Type  Visited With Patient;Health care provider  Visit Type Initial;Spiritual support;Post-op  Referral From Nurse  Consult/Referral To Chaplain  Spiritual Encounters  Spiritual Needs Prayer;Emotional

## 2016-10-13 NOTE — Progress Notes (Signed)
Patient transported to the OR 

## 2016-10-13 NOTE — ED Triage Notes (Signed)
[facility], reports RUQ pain starting last night, report was seen in April for gall stones.  Reports pain radiates to back and pt c/o n/v.  Pt tearful in triage.

## 2016-10-13 NOTE — Progress Notes (Signed)
Dr pabon gave order for aspercream per the patients request for her back pain

## 2016-10-13 NOTE — Anesthesia Post-op Follow-up Note (Signed)
Anesthesia QCDR form completed.        
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
## 2016-10-13 NOTE — Anesthesia Procedure Notes (Signed)
Procedure Name: Intubation Performed by: Mathews ArgyleLOGAN, Casidee Jann Pre-anesthesia Checklist: Patient identified, Patient being monitored, Timeout performed, Emergency Drugs available and Suction available Patient Re-evaluated:Patient Re-evaluated prior to induction Oxygen Delivery Method: Circle system utilized Preoxygenation: Pre-oxygenation with 100% oxygen Induction Type: IV induction Ventilation: Mask ventilation without difficulty Laryngoscope Size: 2 and Miller Grade View: Grade I Tube type: Oral Tube size: 7.0 mm Number of attempts: 1 Airway Equipment and Method: Stylet Placement Confirmation: ETT inserted through vocal cords under direct vision,  positive ETCO2 and breath sounds checked- equal and bilateral Secured at: 21 cm Tube secured with: Tape Dental Injury: Teeth and Oropharynx as per pre-operative assessment

## 2016-10-13 NOTE — Transfer of Care (Signed)
Immediate Anesthesia Transfer of Care Note  Patient: Maria Conway  Procedure(s) Performed: Procedure(s): LAPAROSCOPIC CHOLECYSTECTOMY WITH INTRAOPERATIVE CHOLANGIOGRAM (N/A)  Patient Location: PACU  Anesthesia Type:General  Level of Consciousness: awake  Airway & Oxygen Therapy: Patient Spontanous Breathing and Patient connected to face mask oxygen  Post-op Assessment: Report given to RN and Post -op Vital signs reviewed and stable  Post vital signs: Reviewed  Last Vitals:  Vitals:   10/13/16 0912 10/13/16 1224  BP: (!) 164/93 (!) 143/76  Pulse: 87 95  Resp: 16 18  Temp: (!) 36.1 C   SpO2: 100% 100%    Last Pain:  Vitals:   10/13/16 0912  TempSrc: Tympanic  PainSc: 7       Patients Stated Pain Goal: 3 (10/13/16 0600)  Complications: No apparent anesthesia complications

## 2016-10-14 ENCOUNTER — Encounter: Payer: Self-pay | Admitting: Surgery

## 2016-10-14 LAB — CBC
HCT: 33.4 % — ABNORMAL LOW (ref 35.0–47.0)
Hemoglobin: 11.1 g/dL — ABNORMAL LOW (ref 12.0–16.0)
MCH: 26.6 pg (ref 26.0–34.0)
MCHC: 33.2 g/dL (ref 32.0–36.0)
MCV: 80 fL (ref 80.0–100.0)
Platelets: 216 10*3/uL (ref 150–440)
RBC: 4.17 MIL/uL (ref 3.80–5.20)
RDW: 14.8 % — ABNORMAL HIGH (ref 11.5–14.5)
WBC: 7.7 10*3/uL (ref 3.6–11.0)

## 2016-10-14 LAB — COMPREHENSIVE METABOLIC PANEL WITH GFR
ALT: 114 U/L — ABNORMAL HIGH (ref 14–54)
AST: 113 U/L — ABNORMAL HIGH (ref 15–41)
Albumin: 3.6 g/dL (ref 3.5–5.0)
Alkaline Phosphatase: 180 U/L — ABNORMAL HIGH (ref 38–126)
Anion gap: 8 (ref 5–15)
BUN: 9 mg/dL (ref 6–20)
CO2: 26 mmol/L (ref 22–32)
Calcium: 8.8 mg/dL — ABNORMAL LOW (ref 8.9–10.3)
Chloride: 102 mmol/L (ref 101–111)
Creatinine, Ser: 0.89 mg/dL (ref 0.44–1.00)
GFR calc Af Amer: >60 mL/min
GFR calc non Af Amer: >60 mL/min
Glucose, Bld: 168 mg/dL — ABNORMAL HIGH (ref 65–99)
Potassium: 3.5 mmol/L (ref 3.5–5.1)
Sodium: 136 mmol/L (ref 135–145)
Total Bilirubin: 0.3 mg/dL (ref 0.3–1.2)
Total Protein: 7.1 g/dL (ref 6.5–8.1)

## 2016-10-14 LAB — HIV ANTIBODY (ROUTINE TESTING W REFLEX): HIV Screen 4th Generation wRfx: NONREACTIVE

## 2016-10-14 MED ORDER — PIPERACILLIN-TAZOBACTAM 3.375 G IVPB
3.3750 g | Freq: Three times a day (TID) | INTRAVENOUS | Status: DC
  Administered 2016-10-14 – 2016-10-15 (×4): 3.375 g via INTRAVENOUS
  Filled 2016-10-14 (×4): qty 50

## 2016-10-14 NOTE — Progress Notes (Signed)
Zosyn 3.375gm iv q6h changed to zosyn 3.375gm iv q8h infused over 4 hours per P&T approved protocol, crcl 84.   Luan PullingGarrett Katniss Weedman, PharmD, MBA, Liz ClaiborneBCGP Clinical Pharmacist [facility]

## 2016-10-14 NOTE — Progress Notes (Signed)
POD # 1 gangrenous cholecystitis unable to do gram Doing well AVSS  ast and alt elevated, nml bili  PE NAD Abd: soft, incisions, c/d/i. Serous fluid from JP  A/P Doing well We will continue zosyn for now since she did have gangrenous cholecystitis Continue to trend LFT Likely DC in am

## 2016-10-15 LAB — COMPREHENSIVE METABOLIC PANEL
ALT: 92 U/L — AB (ref 14–54)
AST: 83 U/L — AB (ref 15–41)
Albumin: 3.3 g/dL — ABNORMAL LOW (ref 3.5–5.0)
Alkaline Phosphatase: 182 U/L — ABNORMAL HIGH (ref 38–126)
Anion gap: 9 (ref 5–15)
BUN: 19 mg/dL (ref 6–20)
CHLORIDE: 101 mmol/L (ref 101–111)
CO2: 26 mmol/L (ref 22–32)
Calcium: 8.6 mg/dL — ABNORMAL LOW (ref 8.9–10.3)
Creatinine, Ser: 1.16 mg/dL — ABNORMAL HIGH (ref 0.44–1.00)
GFR, EST AFRICAN AMERICAN: 58 mL/min — AB (ref >60)
GFR, EST NON AFRICAN AMERICAN: 50 mL/min — AB (ref >60)
Glucose, Bld: 227 mg/dL — ABNORMAL HIGH (ref 65–99)
POTASSIUM: 3.8 mmol/L (ref 3.5–5.1)
SODIUM: 136 mmol/L (ref 135–145)
Total Bilirubin: 0.3 mg/dL (ref 0.3–1.2)
Total Protein: 6.5 g/dL (ref 6.5–8.1)

## 2016-10-15 LAB — GLUCOSE, CAPILLARY
GLUCOSE-CAPILLARY: 201 mg/dL — AB (ref 65–99)
GLUCOSE-CAPILLARY: 168 mg/dL — AB (ref 65–99)

## 2016-10-15 MED ORDER — SODIUM CHLORIDE 0.9 % IV SOLN
INTRAVENOUS | Status: DC
  Administered 2016-10-15: 1000 mL via INTRAVENOUS

## 2016-10-15 MED ORDER — ALUM & MAG HYDROXIDE-SIMETH 200-200-20 MG/5ML PO SUSP
30.0000 mL | ORAL | Status: DC | PRN
  Administered 2016-10-15: 30 mL via ORAL
  Filled 2016-10-15: qty 30

## 2016-10-15 MED ORDER — SODIUM CHLORIDE 0.9 % IV BOLUS (SEPSIS)
1000.0000 mL | Freq: Once | INTRAVENOUS | Status: AC
  Administered 2016-10-15: 1000 mL via INTRAVENOUS

## 2016-10-15 MED ORDER — INSULIN ASPART 100 UNIT/ML ~~LOC~~ SOLN
0.0000 [IU] | Freq: Every day | SUBCUTANEOUS | Status: DC
Start: 2016-10-15 — End: 2016-10-16

## 2016-10-15 MED ORDER — INSULIN ASPART 100 UNIT/ML ~~LOC~~ SOLN
0.0000 [IU] | Freq: Three times a day (TID) | SUBCUTANEOUS | Status: DC
  Administered 2016-10-16 (×2): 3 [IU] via SUBCUTANEOUS
  Filled 2016-10-15 (×2): qty 1

## 2016-10-15 NOTE — Progress Notes (Signed)
CH made a follow up visit. Pt was in a much better frame of mind than on previous visit. Husband is bedside. Pt admits to feeling better and expressed some regret for her behavior over the last couple of days. Pt is "speaking from pain" and does not like that side of herself. CH assured that all is understood and offered a prayer for peace and healing. CH is available for follow up as needed.    10/15/16 0900  Clinical Encounter Type  Visited With Patient;Patient and family together  Visit Type Follow-up;Spiritual support;Post-op  Consult/Referral To Chaplain  Spiritual Encounters  Spiritual Needs Prayer;Emotional

## 2016-10-15 NOTE — Progress Notes (Signed)
POD # 2 Taking some PO but not enough to keep with hydration Increase in BUN and creat Pain improving LFTs trending down  PE NAD Abd: soft, NT JP serous drainage. Incisions c/d/i, no peritonitis.  A/p Dehydrated, give saline bolus and place back on ivf We will recheck labs in am and if hydration stratus is optimal and LFT continue to trend down we will DC    Sterling Bigiego Pabon, MD, FACS  10/15/2016

## 2016-10-16 LAB — COMPREHENSIVE METABOLIC PANEL
ALT: 136 U/L — AB (ref 14–54)
AST: 136 U/L — AB (ref 15–41)
Albumin: 3.8 g/dL (ref 3.5–5.0)
Alkaline Phosphatase: 259 U/L — ABNORMAL HIGH (ref 38–126)
Anion gap: 8 (ref 5–15)
BILIRUBIN TOTAL: 0.3 mg/dL (ref 0.3–1.2)
BUN: 13 mg/dL (ref 6–20)
CO2: 28 mmol/L (ref 22–32)
CREATININE: 0.86 mg/dL (ref 0.44–1.00)
Calcium: 9.2 mg/dL (ref 8.9–10.3)
Chloride: 102 mmol/L (ref 101–111)
GFR calc Af Amer: >60 mL/min (ref >60)
Glucose, Bld: 155 mg/dL — ABNORMAL HIGH (ref 65–99)
Potassium: 4.1 mmol/L (ref 3.5–5.1)
Sodium: 138 mmol/L (ref 135–145)
TOTAL PROTEIN: 7.4 g/dL (ref 6.5–8.1)

## 2016-10-16 LAB — GLUCOSE, CAPILLARY
GLUCOSE-CAPILLARY: 182 mg/dL — AB (ref 65–99)
Glucose-Capillary: 174 mg/dL — ABNORMAL HIGH (ref 65–99)

## 2016-10-16 LAB — SURGICAL PATHOLOGY

## 2016-10-16 MED ORDER — OXYCODONE-ACETAMINOPHEN 5-325 MG PO TABS: 1.0000 | ORAL_TABLET | ORAL | 0 refills | Status: DC | PRN

## 2016-10-16 NOTE — Discharge Planning (Signed)
Patient IV x2 removed.  JP drain also removed by MD.  Discharge papers given, explained, educated.  Pain script given.  Informed of suggested FU appts and appts made.  RN assessment and VS revealed stability for DC to home. Once ready, will be walked to front and family transporting home via car.

## 2016-10-26 NOTE — Discharge Summary (Signed)
Physician Discharge Summary  Patient ID: Maria Conway MRN: [id number] DOB/AGE: [birth year] 61 y.o.  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 Patient was hydrated and underwent laparoscopic cholecystectomy with findings of gangrenous cholecystitis and placement of drain at the gallbladder fossa. Post-operatively, patient's pain was well-controlled, though she was slow to recover her appetite and required additional IV hydration. Upon tolerating her diet with improved energy and recovery of ability to independently perform ambulation and activities of daily living, discharge planning was initiated, and patient was safely able to be discharged home with appropriate follow-up, pain control, and discharge instructions.  Consults: None  Significant Diagnostic Studies: radiology: Ultrasound: acute cholecystitis  Treatments: IV hydration and surgery: laparoscopic cholecystectomy  Discharge Exam: Blood pressure (!) 148/82, pulse 84, temperature (!) 97.5 F (36.4 C), temperature source Oral, resp. rate 18, height 5\' 6"  (1.676 m), weight 241 lb (109.3 kg), SpO2 96 %. General appearance: alert, cooperative and no distress GI: soft and non-distended with mild epigastric peri-incisional abdominal tenderness to palpation with incisions well-approximated without surrounding erythema or drainage, drain with minimal serosanguinous fluid removed prior to discharge  Disposition: 01-Home or Self Care   Allergies as of 10/16/2016      Reactions   Aspartame And Phenylalanine Other (See Comments), Cough   Reaction: throat irritation   Lisinopril Cough   Other reaction(s): Cough      Medication  List    TAKE these medications   albuterol 108 (90 Base) MCG/ACT inhaler Commonly known as:  PROVENTIL HFA;VENTOLIN HFA Inhale 2 puffs into the lungs every 4 (four) hours as needed for wheezing or shortness of breath.   amLODipine 10 MG tablet Commonly known as:  NORVASC Take 10 mg by mouth daily.   atorvastatin 20 MG tablet Commonly known as:  LIPITOR Take 20 mg by mouth daily.   baclofen 5 mg Tabs tablet Commonly known as:  LIORESAL Take 5 mg by mouth 3 (three) times daily.   beclomethasone 40 MCG/ACT inhaler Commonly known as:  QVAR Inhale 2 puffs into the lungs 2 (two) times daily.   buPROPion 150 MG 24 hr tablet Commonly known as:  WELLBUTRIN XL Take 150 mg by mouth daily.   exenatide 5 MCG/0.02ML Sopn injection Commonly known as:  BYETTA Inject 10 mcg into the skin 2 (two) times daily.   gabapentin 600 MG tablet Commonly known as:  NEURONTIN Take 600 mg by mouth 3 (three) times daily.   hydrochlorothiazide 25 MG tablet Commonly known as:  HYDRODIURIL Take 25 mg by mouth daily.   lisdexamfetamine 30 MG capsule Commonly known as:  VYVANSE Take 30 mg by mouth daily.   metFORMIN 500 MG 24 hr tablet Commonly known as:  GLUCOPHAGE-XR Take 2,000 mg by mouth daily.   omeprazole 20 MG capsule Commonly known as:  PRILOSEC Take 1 capsule (20 mg total) by mouth daily.   oxyCODONE-acetaminophen 5-325 MG tablet Commonly known as:  ROXICET Take 1-2 tablets by mouth every 4 (four) hours as needed for severe pain.   sertraline 100 MG tablet Commonly known as:  ZOLOFT Take 250 mg by mouth daily.            Discharge Care Instructions  Start     Ordered   10/16/16 0000  oxyCODONE-acetaminophen (ROXICET) 5-325 MG tablet  Every 4 hours PRN     10/16/16 1240     Follow-up Information    [facility] Go on 10/30/2016.   Specialty:  General Surgery Why:  Dr. Everlene Farrier, Monday, August 27 at 1:15 p.m. [phone] Contact  information: [address] [place] [address] [phone]          Signed: Ancil Linsey 10/26/2016, 6:09 PM

## 2016-10-30 ENCOUNTER — Telehealth: Payer: Self-pay | Admitting: General Practice

## 2016-10-30 ENCOUNTER — Encounter: Payer: Self-pay | Admitting: Surgery

## 2016-10-30 NOTE — Telephone Encounter (Signed)
Left message for patient to call the office to r/s no showed appointment on 10/30/16 with Dr. Everlene Farrier. Please r/s if patient calls back.

## 2017-01-24 ENCOUNTER — Telehealth: Payer: Self-pay | Admitting: Pharmacy Technician

## 2017-01-24 NOTE — Telephone Encounter (Signed)
Patient failed to provide 2018 poi.  No additional medication assistance will be provided by [facility] without the required proof of income documentation.  Patient notified by letter.  Glennys Schorsch J. Trinidy Masterson Care Manager [facility] 

## 2017-04-22 ENCOUNTER — Emergency Department
Admission: EM | Admit: 2017-04-22 | Discharge: 2017-04-22 | Disposition: A | Discharge status: Home / Self Care | Payer: Self-pay | Attending: Emergency Medicine | Admitting: Emergency Medicine

## 2017-04-22 ENCOUNTER — Encounter: Payer: Self-pay | Admitting: *Deleted

## 2017-04-22 ENCOUNTER — Other Ambulatory Visit: Payer: Self-pay

## 2017-04-22 DIAGNOSIS — Z7984 Long term (current) use of oral hypoglycemic drugs: Secondary | ICD-10-CM | POA: Insufficient documentation

## 2017-04-22 DIAGNOSIS — M545 Low back pain: Secondary | ICD-10-CM | POA: Insufficient documentation

## 2017-04-22 DIAGNOSIS — E119 Type 2 diabetes mellitus without complications: Secondary | ICD-10-CM | POA: Insufficient documentation

## 2017-04-22 DIAGNOSIS — G8929 Other chronic pain: Secondary | ICD-10-CM

## 2017-04-22 DIAGNOSIS — I10 Essential (primary) hypertension: Secondary | ICD-10-CM | POA: Insufficient documentation

## 2017-04-22 DIAGNOSIS — M549 Dorsalgia, unspecified: Secondary | ICD-10-CM

## 2017-04-22 DIAGNOSIS — Z87891 Personal history of nicotine dependence: Secondary | ICD-10-CM | POA: Insufficient documentation

## 2017-04-22 DIAGNOSIS — J45909 Unspecified asthma, uncomplicated: Secondary | ICD-10-CM | POA: Insufficient documentation

## 2017-04-22 MED ORDER — KETOROLAC TROMETHAMINE 60 MG/2ML IM SOLN
60.0000 mg | Freq: Once | INTRAMUSCULAR | Status: AC
  Administered 2017-04-22: 60 mg via INTRAMUSCULAR
  Filled 2017-04-22: qty 2

## 2017-04-22 NOTE — ED Triage Notes (Addendum)
Patient c/o chronic back painthat is radiating down both legs with the left leg in more pain and has numbness. Patient states pain is present everyday, but is worse today. Patient state she had a steroid injection two weeks ago that was effective for a day or two only. Patient c/o left hip pain, sacral pain and cervical pain.
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
## 2017-04-22 NOTE — Discharge Instructions (Signed)
Please seek medical attention for any high fevers, chest pain, shortness of breath, change in behavior, persistent vomiting, bloody stool or any other new or concerning symptoms.  

## 2017-04-22 NOTE — ED Notes (Signed)
First Nurse note:  Patient currently eating  And continually getting out of wheel chair to be ambulatory in the lobby.

## 2017-05-09 ENCOUNTER — Emergency Department: Payer: Self-pay

## 2017-05-09 ENCOUNTER — Emergency Department
Admission: EM | Admit: 2017-05-09 | Discharge: 2017-05-09 | Disposition: A | Discharge status: Home / Self Care | Payer: Self-pay | Attending: Emergency Medicine | Admitting: Emergency Medicine

## 2017-05-09 ENCOUNTER — Other Ambulatory Visit: Payer: Self-pay

## 2017-05-09 ENCOUNTER — Encounter: Payer: Self-pay | Admitting: Emergency Medicine

## 2017-05-09 DIAGNOSIS — Z7984 Long term (current) use of oral hypoglycemic drugs: Secondary | ICD-10-CM | POA: Insufficient documentation

## 2017-05-09 DIAGNOSIS — I10 Essential (primary) hypertension: Secondary | ICD-10-CM | POA: Insufficient documentation

## 2017-05-09 DIAGNOSIS — Z87891 Personal history of nicotine dependence: Secondary | ICD-10-CM | POA: Insufficient documentation

## 2017-05-09 DIAGNOSIS — E119 Type 2 diabetes mellitus without complications: Secondary | ICD-10-CM | POA: Insufficient documentation

## 2017-05-09 DIAGNOSIS — R51 Headache: Secondary | ICD-10-CM | POA: Insufficient documentation

## 2017-05-09 DIAGNOSIS — Z79899 Other long term (current) drug therapy: Secondary | ICD-10-CM | POA: Insufficient documentation

## 2017-05-09 DIAGNOSIS — J45909 Unspecified asthma, uncomplicated: Secondary | ICD-10-CM | POA: Insufficient documentation

## 2017-05-09 DIAGNOSIS — S0990XA Unspecified injury of head, initial encounter: Secondary | ICD-10-CM

## 2017-05-09 MED ORDER — BUTALBITAL-APAP-CAFFEINE 50-325-40 MG PO TABS: 1.0000 | ORAL_TABLET | Freq: Four times a day (QID) | ORAL | 0 refills | Status: AC | PRN
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
## 2017-05-09 NOTE — ED Triage Notes (Addendum)
Patient ambulatory to triage with steady gait, without difficulty or distress noted; pt reports bent over to pick something up and hit head on dresser on Monday; denies LOC but c/o "pins and needles in head" since; pt st she has "chronic pain"

## 2017-06-20 IMAGING — US US ABDOMEN LIMITED
1 series · 14 of 25 positions shown · non-contrast
Comparison: None.

CLINICAL DATA: Right upper quadrant pain for several days, changes
of cholecystitis on recent CT examination.

EXAM:
US ABDOMEN LIMITED - RIGHT UPPER QUADRANT

[Series 1: us abdomen limited · 0.20mm/px · 14 of 67 slices shown]
[im 1/67]
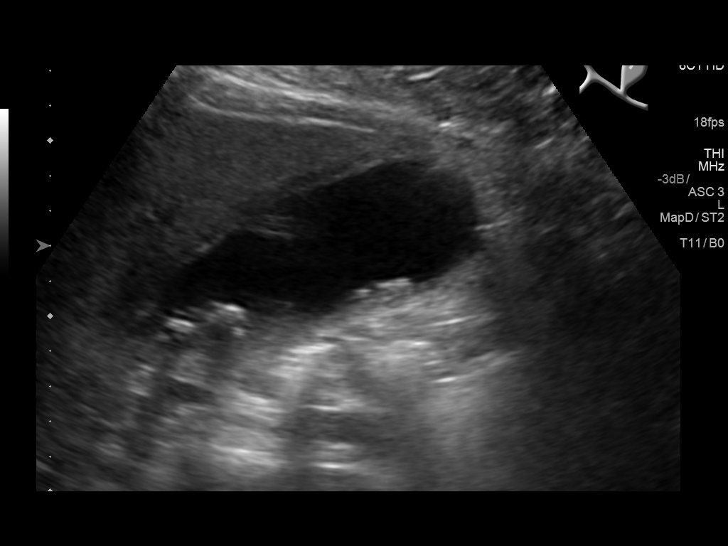
[im 6/67]
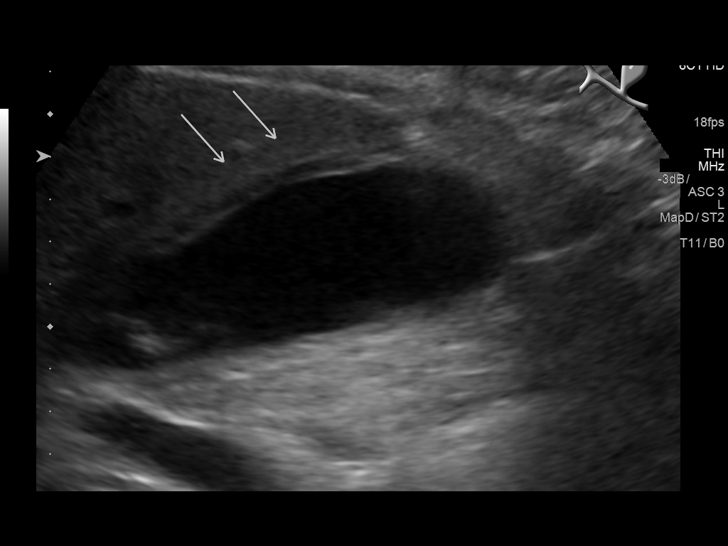
[im 12/67]
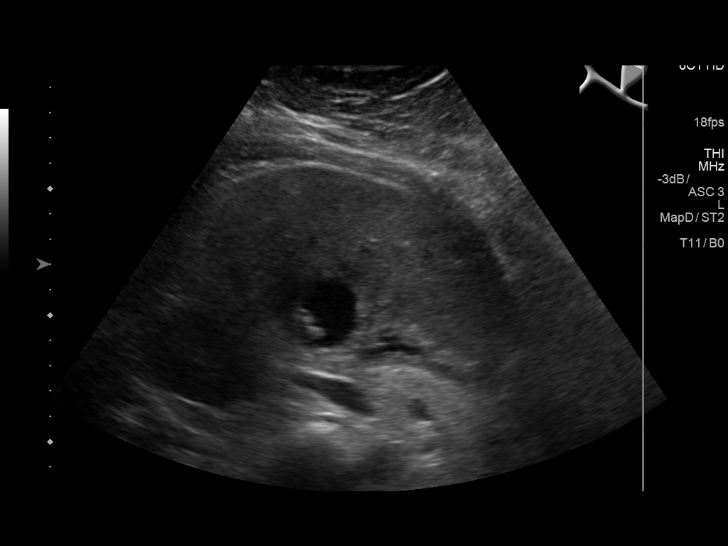
[im 17/67]
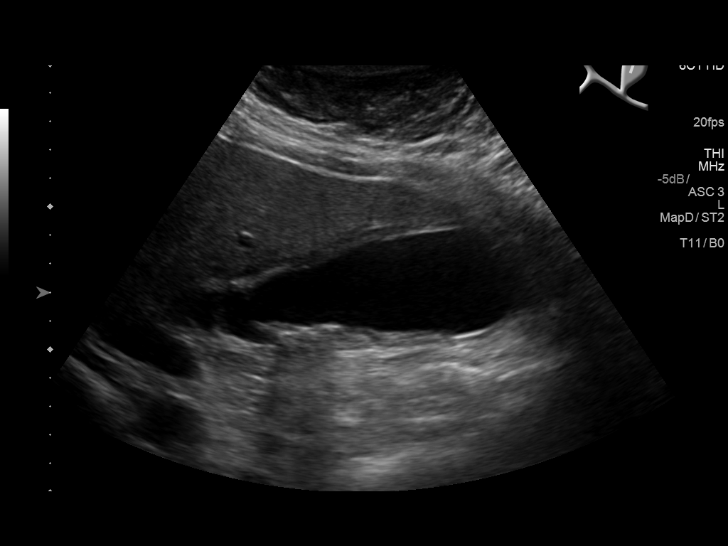
[im 23/67]
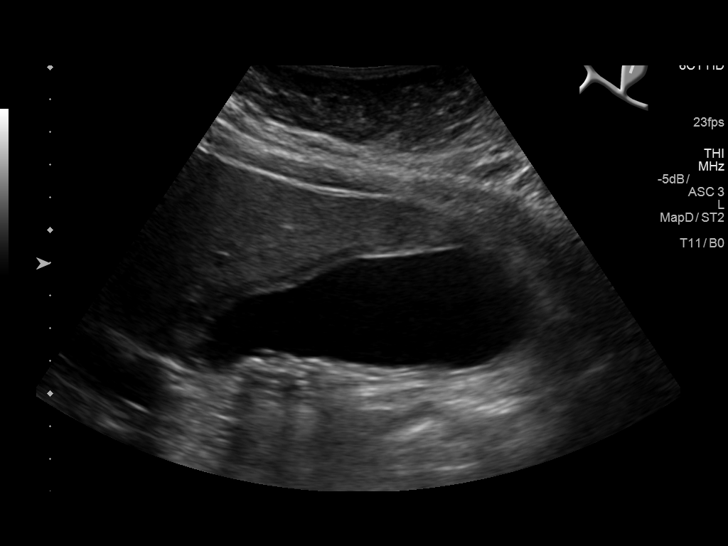
[im 25/67]
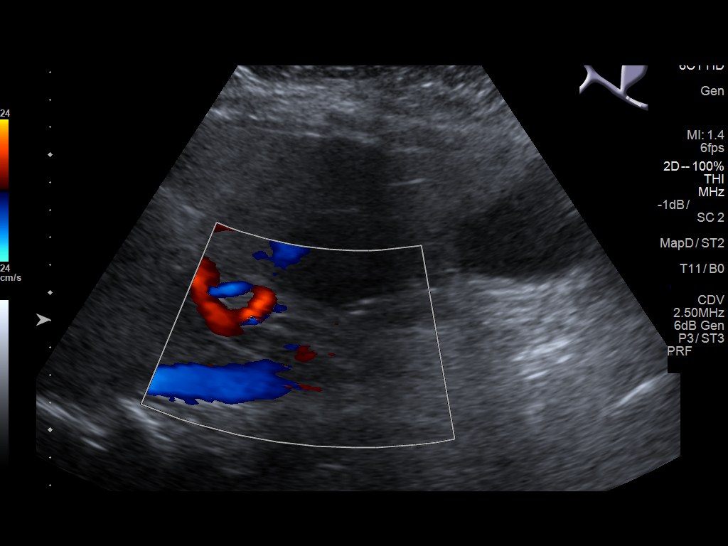
[im 31/67]
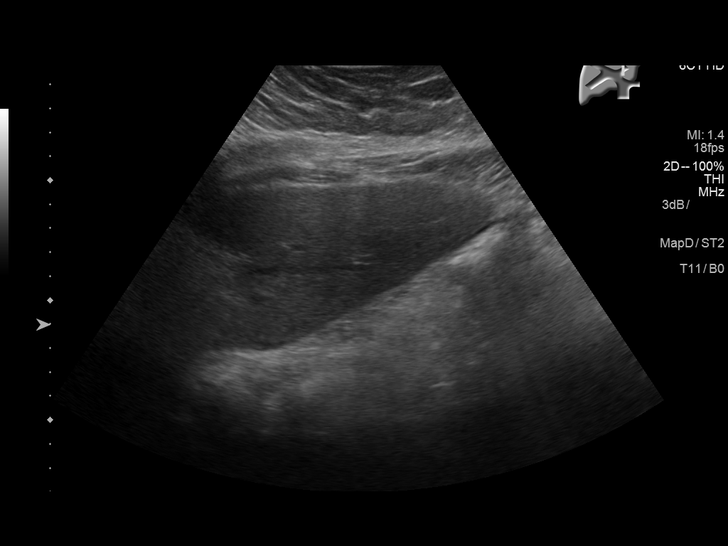
[im 36/67]
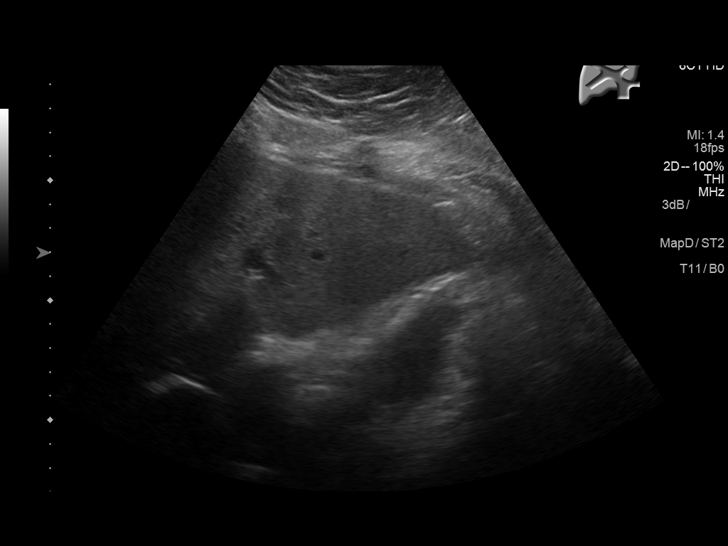
[im 42/67]
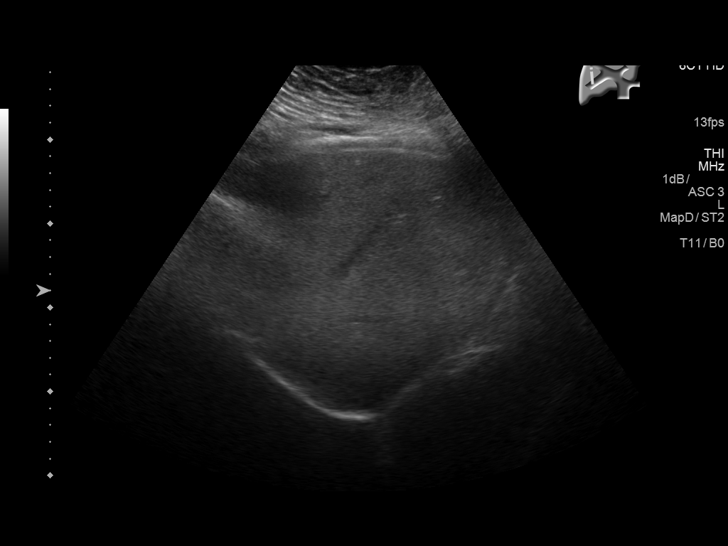
[im 45/67]
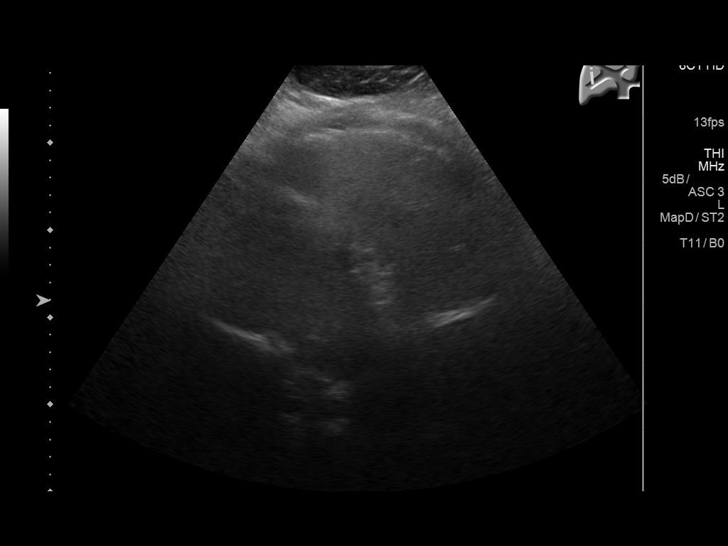
[im 50/67]
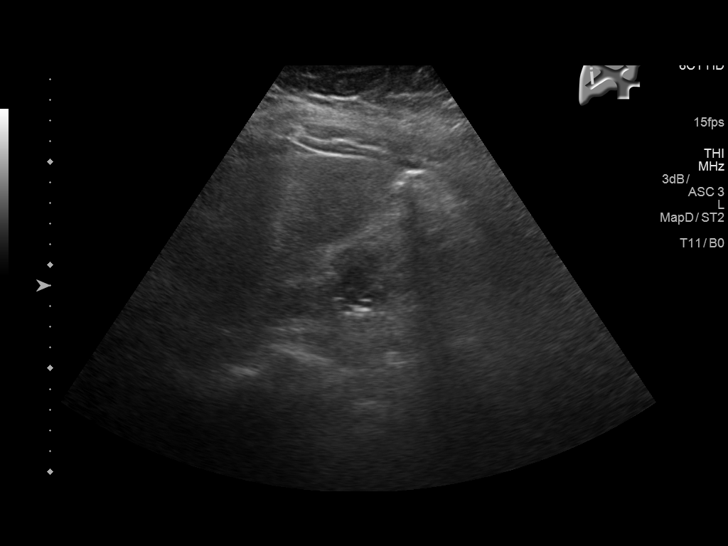
[im 56/67]
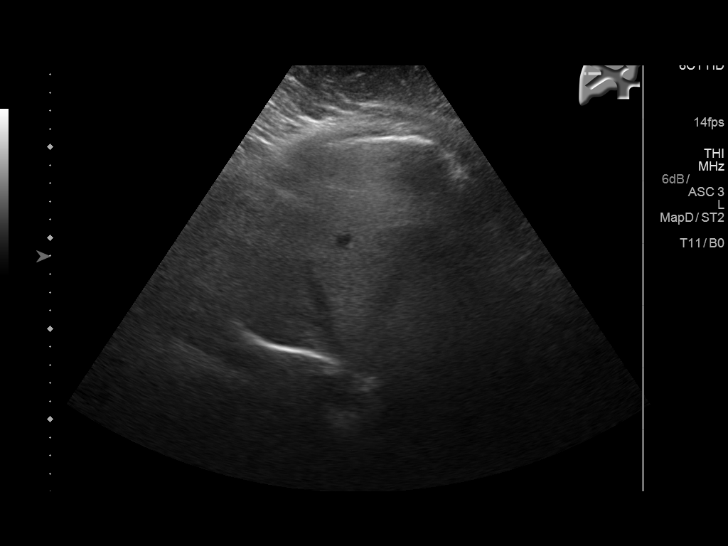
[im 61/67]
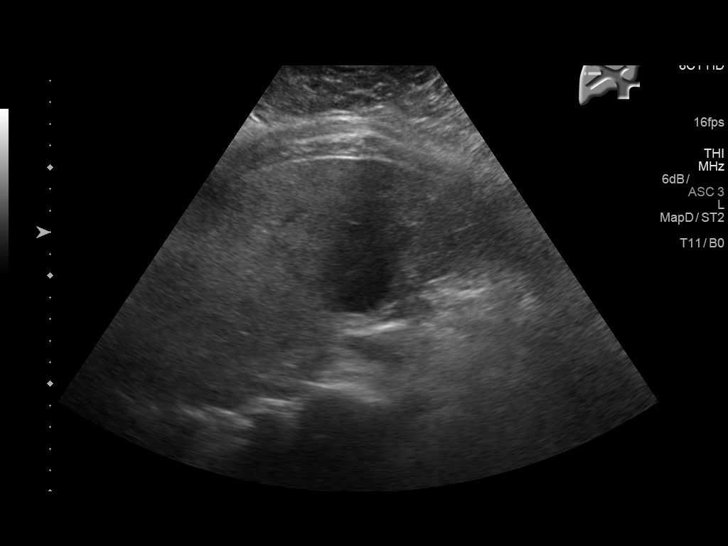
[im 67/67]
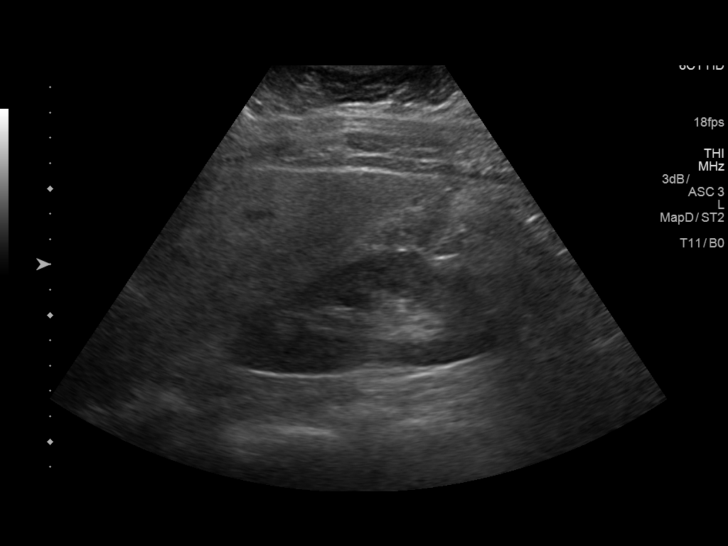

[14 of 25 positions shown; findings below may reference images not displayed]

FINDINGS: Gallbladder:

Gallbladder is well distended and demonstrates gallbladder wall
thickening to 6 mm with evidence of cholelithiasis.

Common bile duct:

Diameter: 4 mm.

Liver:

Slight increased in echogenicity consistent with fatty infiltration.
IMPRESSION: Changes consistent with acute calculous cholecystitis

## 2017-10-13 IMAGING — US US ABDOMEN LIMITED
1 series · 14 of 25 positions shown · non-contrast
Comparison: CT of the abdomen and pelvis, and right upper quadrant
ultrasound, performed 06/20/2016

CLINICAL DATA: Acute onset of right upper quadrant abdominal pain.
Initial encounter.

EXAM:
ULTRASOUND ABDOMEN LIMITED RIGHT UPPER QUADRANT

[Series 1: us abdomen limited · 0.25mm/px · 14 of 74 slices shown]
[im 1/74]
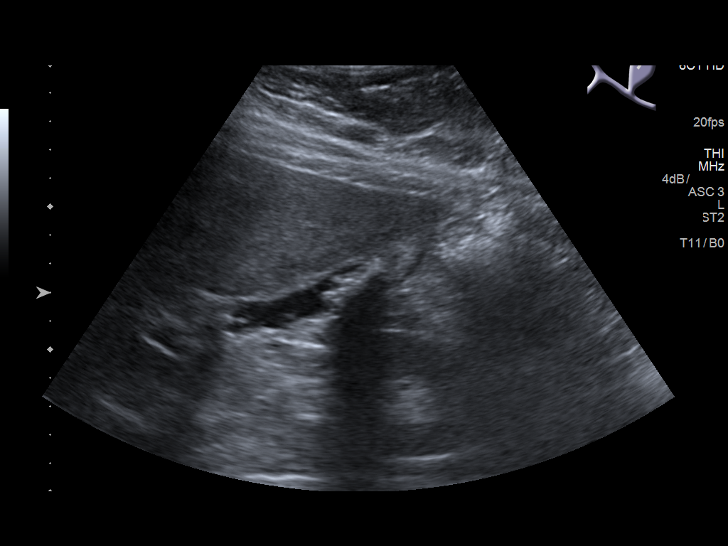
[im 7/74]
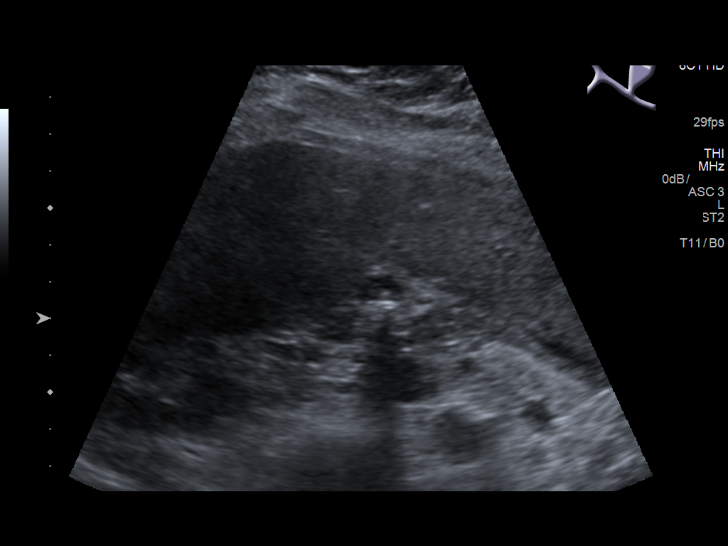
[im 13/74]
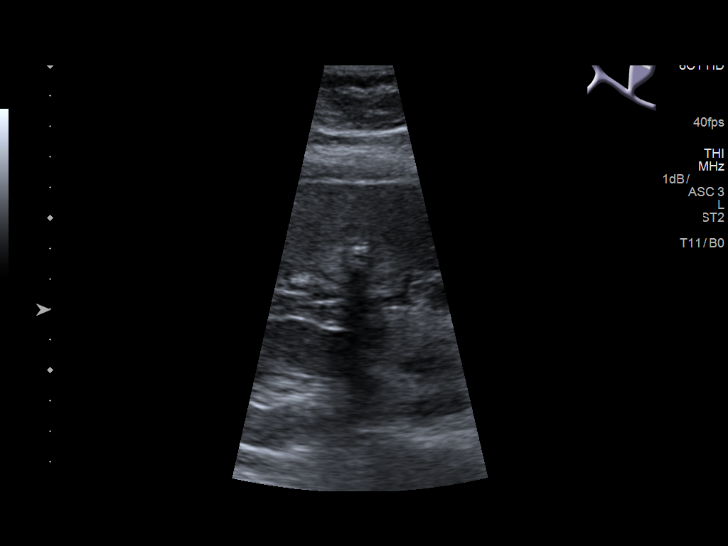
[im 19/74]
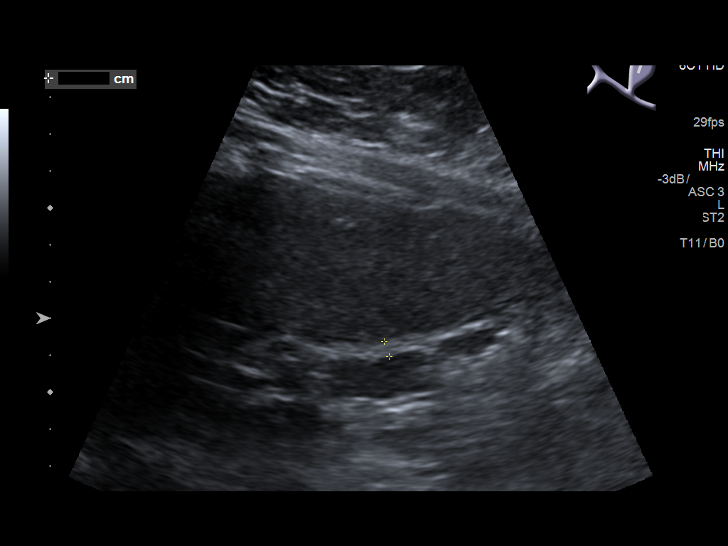
[im 25/74]
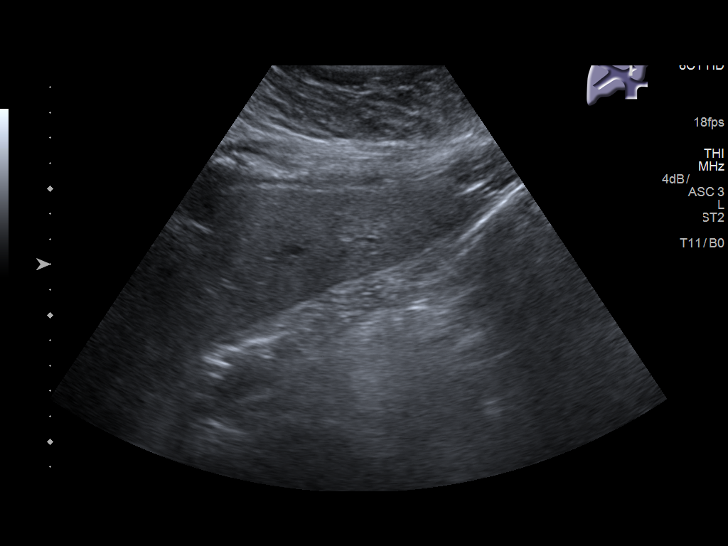
[im 28/74]
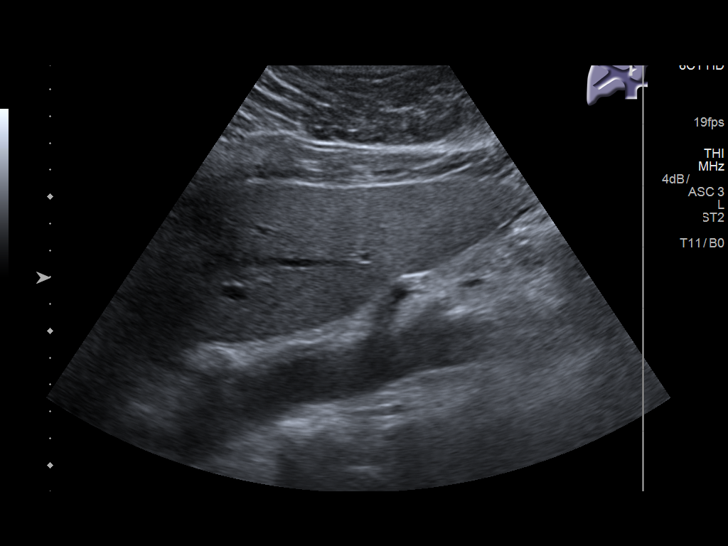
[im 34/74]
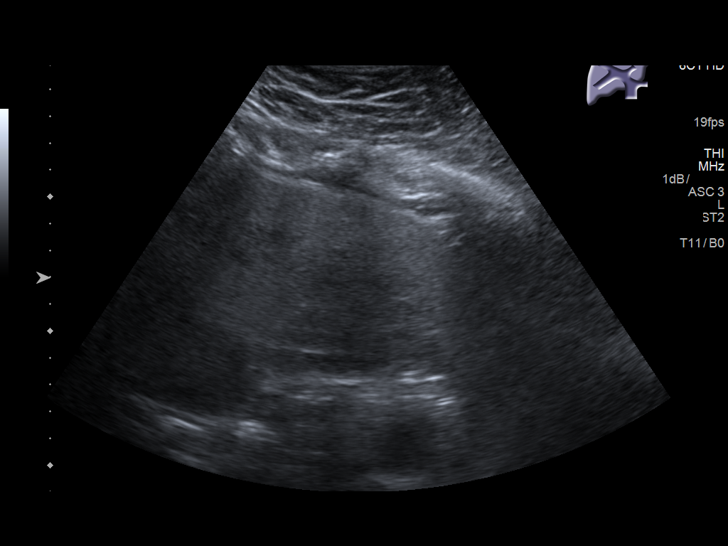
[im 40/74]
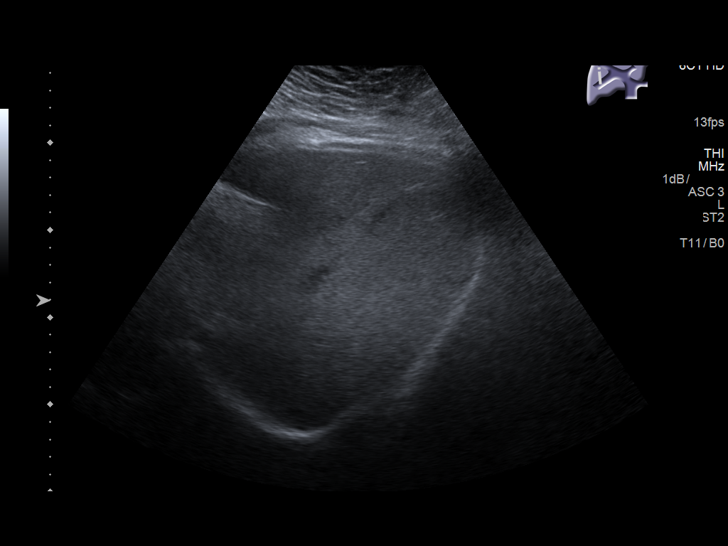
[im 46/74]
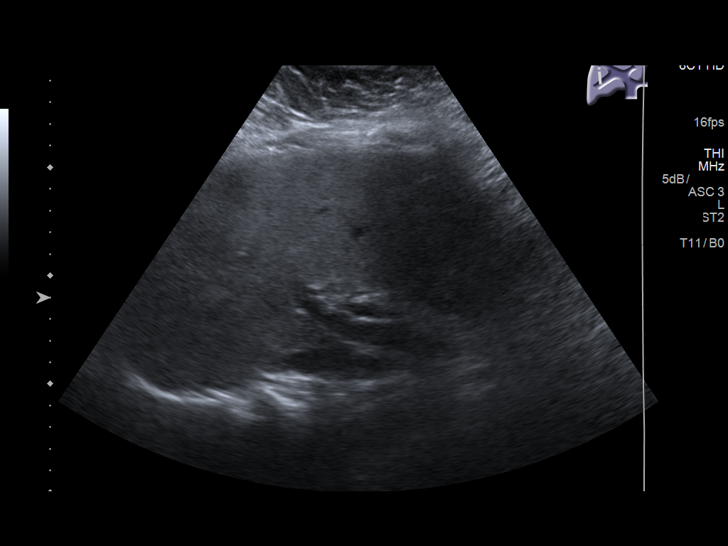
[im 49/74]
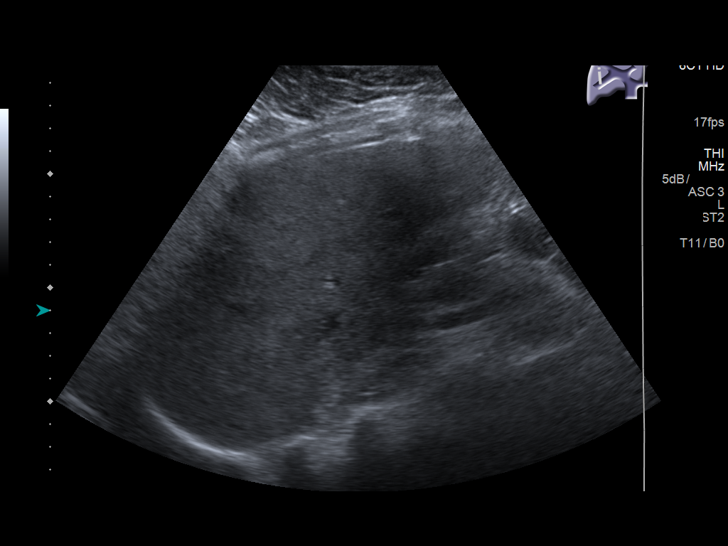
[im 55/74]
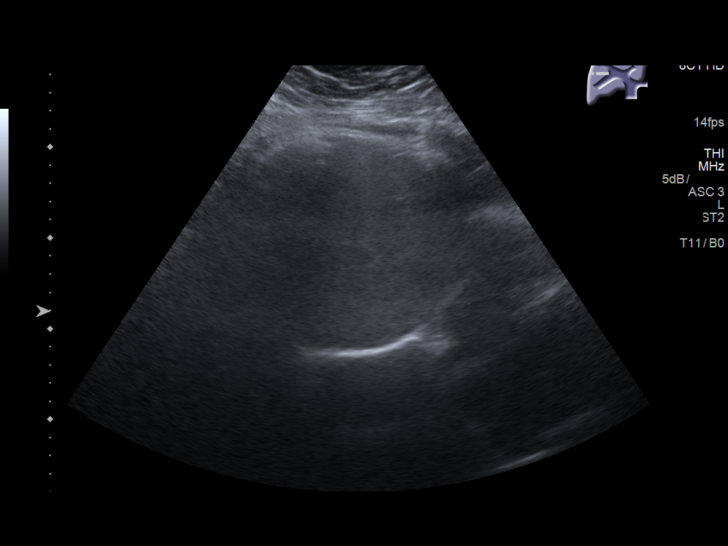
[im 61/74]
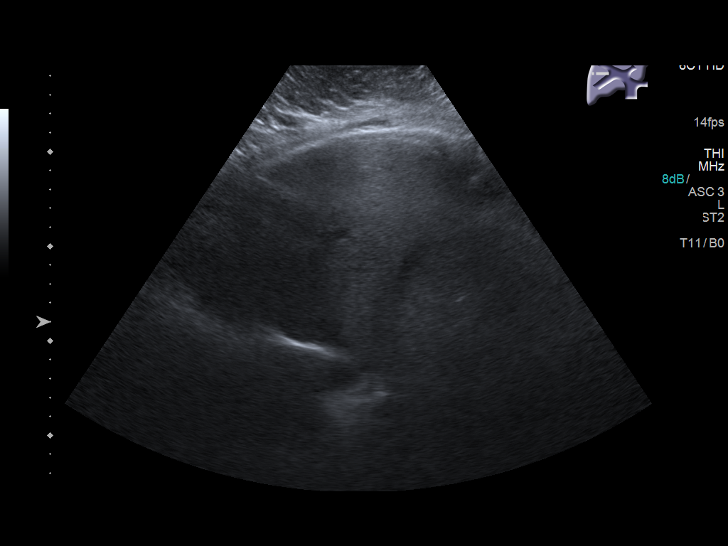
[im 67/74]
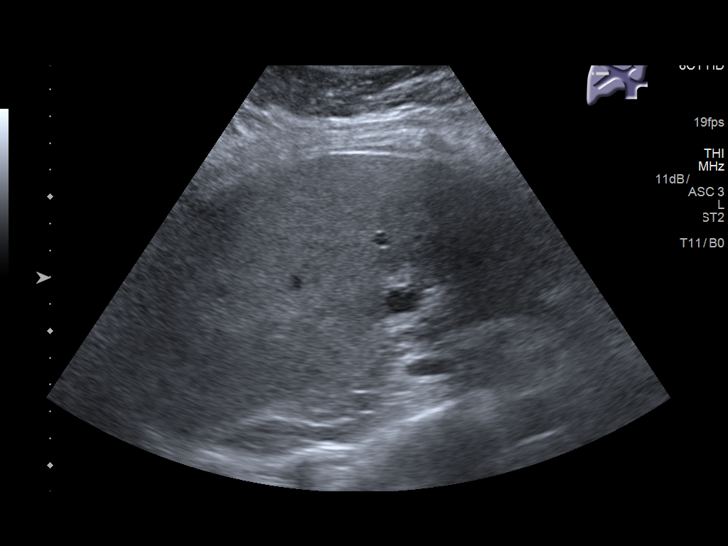
[im 74/74]
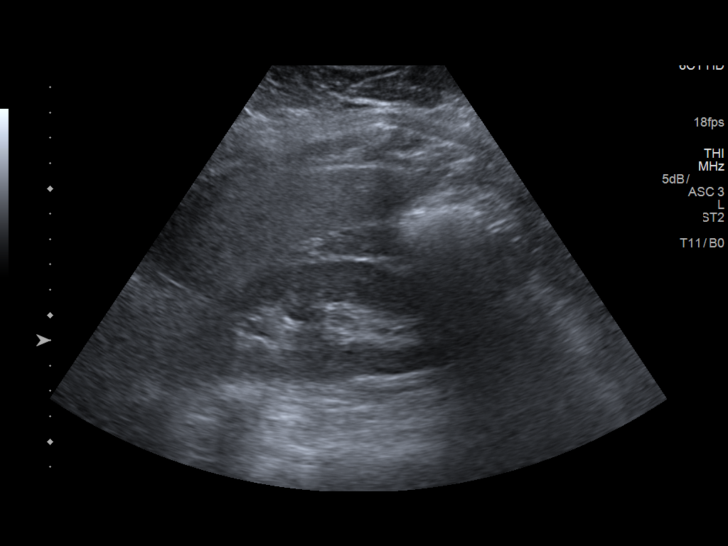

[14 of 25 positions shown; findings below may reference images not displayed]

FINDINGS: Gallbladder:

Diffuse gallbladder wall thickening is noted. No pericholecystic
fluid is seen. Stones measure up to 1.0 cm in size. A positive
ultrasonographic Murphy's sign is elicited, concerning for
cholecystitis.

Common bile duct:

Diameter: 0.6 cm, within normal limits in caliber.

Liver:

No focal lesion identified. Diffusely increased parenchymal
echogenicity and coarsened echotexture, compatible with fatty
infiltration.
IMPRESSION: 1. Diffuse gallbladder wall thickening, with underlying
cholelithiasis. Positive ultrasonographic Murphy's sign elicited.
Findings concerning for acute cholecystitis.
2. Diffuse fatty infiltration within the liver.

## 2017-11-17 ENCOUNTER — Emergency Department: Payer: Medicaid Other

## 2017-11-17 ENCOUNTER — Emergency Department
Admission: EM | Admit: 2017-11-17 | Discharge: 2017-11-17 | Disposition: A | Discharge status: Home / Self Care | Payer: Medicaid Other | Attending: Emergency Medicine | Admitting: Emergency Medicine

## 2017-11-17 ENCOUNTER — Other Ambulatory Visit: Payer: Self-pay

## 2017-11-17 DIAGNOSIS — E119 Type 2 diabetes mellitus without complications: Secondary | ICD-10-CM | POA: Insufficient documentation

## 2017-11-17 DIAGNOSIS — G8918 Other acute postprocedural pain: Secondary | ICD-10-CM | POA: Diagnosis not present

## 2017-11-17 DIAGNOSIS — Z043 Encounter for examination and observation following other accident: Secondary | ICD-10-CM | POA: Insufficient documentation

## 2017-11-17 DIAGNOSIS — Z7984 Long term (current) use of oral hypoglycemic drugs: Secondary | ICD-10-CM | POA: Insufficient documentation

## 2017-11-17 DIAGNOSIS — Z79899 Other long term (current) drug therapy: Secondary | ICD-10-CM | POA: Diagnosis not present

## 2017-11-17 DIAGNOSIS — Z87891 Personal history of nicotine dependence: Secondary | ICD-10-CM | POA: Insufficient documentation

## 2017-11-17 DIAGNOSIS — M549 Dorsalgia, unspecified: Secondary | ICD-10-CM

## 2017-11-17 DIAGNOSIS — J45909 Unspecified asthma, uncomplicated: Secondary | ICD-10-CM | POA: Diagnosis not present

## 2017-11-17 DIAGNOSIS — W19XXXA Unspecified fall, initial encounter: Secondary | ICD-10-CM

## 2017-11-17 DIAGNOSIS — I10 Essential (primary) hypertension: Secondary | ICD-10-CM | POA: Diagnosis not present

## 2017-11-17 MED ORDER — NALOXONE HCL 0.4 MG/ML IJ SOLN
0.40 | INTRAMUSCULAR | Status: DC
Start: ? — End: 2017-11-17

## 2017-11-17 MED ORDER — ACETAMINOPHEN 325 MG PO TABS
650.00 | ORAL_TABLET | ORAL | Status: DC
Start: ? — End: 2017-11-17

## 2017-11-17 MED ORDER — INSULIN LISPRO 100 UNIT/ML ~~LOC~~ SOLN
.00 | SUBCUTANEOUS | Status: DC
Start: 2017-11-17 — End: 2017-11-17

## 2017-11-17 MED ORDER — INSULIN GLARGINE 100 UNIT/ML ~~LOC~~ SOLN
16.00 | SUBCUTANEOUS | Status: DC
Start: 2017-11-17 — End: 2017-11-17

## 2017-11-17 MED ORDER — DOCUSATE SODIUM 100 MG PO CAPS
100.00 | ORAL_CAPSULE | ORAL | Status: DC
Start: 2017-11-17 — End: 2017-11-17

## 2017-11-17 MED ORDER — ACETAMINOPHEN 325 MG PO TABS
650.00 | ORAL_TABLET | ORAL | Status: DC
Start: 2017-11-17 — End: 2017-11-17

## 2017-11-17 MED ORDER — GENERIC EXTERNAL MEDICATION
1.00 | Status: DC
Start: ? — End: 2017-11-17

## 2017-11-17 MED ORDER — BACLOFEN 10 MG PO TABS
10.00 | ORAL_TABLET | ORAL | Status: DC
Start: ? — End: 2017-11-17

## 2017-11-17 MED ORDER — ONDANSETRON 4 MG PO TBDP
4.00 | ORAL_TABLET | ORAL | Status: DC
Start: ? — End: 2017-11-17

## 2017-11-17 MED ORDER — METFORMIN HCL 500 MG PO TABS
1000.00 | ORAL_TABLET | ORAL | Status: DC
Start: 2017-11-17 — End: 2017-11-17

## 2017-11-17 MED ORDER — DIPHENHYDRAMINE HCL 50 MG/ML IJ SOLN
25.00 | INTRAMUSCULAR | Status: DC
Start: ? — End: 2017-11-17

## 2017-11-17 MED ORDER — BISACODYL 10 MG RE SUPP
10.00 | RECTAL | Status: DC
Start: ? — End: 2017-11-17

## 2017-11-17 MED ORDER — LACTATED RINGERS IV SOLN
100.00 | INTRAVENOUS | Status: DC
Start: ? — End: 2017-11-17

## 2017-11-17 MED ORDER — LACTATED RINGERS IV SOLN
10.00 | INTRAVENOUS | Status: DC
Start: ? — End: 2017-11-17

## 2017-11-17 MED ORDER — ONDANSETRON HCL 4 MG/2ML IJ SOLN
4.0000 mg | Freq: Once | INTRAMUSCULAR | Status: AC
  Administered 2017-11-17: 4 mg via INTRAVENOUS
  Filled 2017-11-17: qty 2

## 2017-11-17 MED ORDER — ATORVASTATIN CALCIUM 20 MG PO TABS
20.00 | ORAL_TABLET | ORAL | Status: DC
Start: 2017-11-18 — End: 2017-11-17

## 2017-11-17 MED ORDER — DEXTROSE 50 % IV SOLN
12.50 | INTRAVENOUS | Status: DC
Start: ? — End: 2017-11-17

## 2017-11-17 MED ORDER — HYDROCHLOROTHIAZIDE 25 MG PO TABS
25.00 | ORAL_TABLET | ORAL | Status: DC
Start: 2017-11-18 — End: 2017-11-17

## 2017-11-17 MED ORDER — GENERIC EXTERNAL MEDICATION
5.00 | Status: DC
Start: ? — End: 2017-11-17

## 2017-11-17 MED ORDER — MORPHINE SULFATE (PF) 4 MG/ML IV SOLN
4.0000 mg | Freq: Once | INTRAVENOUS | Status: AC
  Administered 2017-11-17: 4 mg via INTRAVENOUS
  Filled 2017-11-17: qty 1

## 2017-11-17 MED ORDER — GABAPENTIN 400 MG PO CAPS
800.00 | ORAL_CAPSULE | ORAL | Status: DC
Start: 2017-11-17 — End: 2017-11-17

## 2017-11-17 MED ORDER — OXYBUTYNIN CHLORIDE 5 MG PO TABS
5.00 | ORAL_TABLET | ORAL | Status: DC
Start: 2017-11-17 — End: 2017-11-17

## 2017-11-17 MED ORDER — AMPHETAMINE-DEXTROAMPHET ER 10 MG PO CP24
10.00 | ORAL_CAPSULE | ORAL | Status: DC
Start: 2017-11-18 — End: 2017-11-17

## 2017-11-17 MED ORDER — ONDANSETRON HCL 4 MG/2ML IJ SOLN
4.00 | INTRAMUSCULAR | Status: DC
Start: ? — End: 2017-11-17

## 2017-11-17 MED ORDER — BUPROPION HCL ER (XL) 300 MG PO TB24
300.00 | ORAL_TABLET | ORAL | Status: DC
Start: 2017-11-18 — End: 2017-11-17

## 2017-11-17 MED ORDER — GENERIC EXTERNAL MEDICATION
250.00 | Status: DC
Start: 2017-11-18 — End: 2017-11-17

## 2017-11-17 MED ORDER — GENERIC EXTERNAL MEDICATION
1.00 | Status: DC
Start: 2017-11-17 — End: 2017-11-17

## 2017-11-17 MED ORDER — LACTATED RINGERS IV SOLN
20.00 | INTRAVENOUS | Status: DC
Start: ? — End: 2017-11-17

## 2017-11-17 MED ORDER — PANTOPRAZOLE SODIUM 20 MG PO TBEC
20.00 | DELAYED_RELEASE_TABLET | ORAL | Status: DC
Start: 2017-11-18 — End: 2017-11-17

## 2017-11-17 MED ORDER — ALBUTEROL SULFATE HFA 108 (90 BASE) MCG/ACT IN AERS
2.00 | INHALATION_SPRAY | RESPIRATORY_TRACT | Status: DC
Start: ? — End: 2017-11-17

## 2017-11-17 MED ORDER — AMLODIPINE BESYLATE 10 MG PO TABS
10.00 | ORAL_TABLET | ORAL | Status: DC
Start: 2017-11-18 — End: 2017-11-17

## 2017-11-17 MED ORDER — GENERIC EXTERNAL MEDICATION
2.00 | Status: DC
Start: ? — End: 2017-11-17

## 2017-11-17 MED ORDER — POLYETHYLENE GLYCOL 3350 17 G PO PACK
17.00 | PACK | ORAL | Status: DC
Start: 2017-11-18 — End: 2017-11-17
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
## 2017-11-17 NOTE — ED Notes (Signed)
Pt states to EDP that pain is the same as it was on the [facility]. Pt states that she didn't want to come here as she just needs things documented. 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 Pt becoming tearful with movement. EDP examaning incision site at this time. Pt states she is in no pain from fall, it is chronic pain.

## 2017-11-17 NOTE — Discharge Instructions (Addendum)

## 2017-11-17 NOTE — ED Triage Notes (Addendum)
Pt presents today via [facility] from home for fall, slide down wall from a roller chair trying to get up to walker Pt is crying in pain and [facility] pt was d/c with walker. Pt states l  hip wall, NO LOC , pt was seen [facility] on wed thoracic lumbar infusion. LLE pain that goes to back. Pt is A/ox4 Pt took oxi 10 mg for pain, it was filled today.  VS:  117 HR 97 RA 138/74 BP

## 2017-12-06 ENCOUNTER — Other Ambulatory Visit: Payer: Self-pay

## 2017-12-06 ENCOUNTER — Emergency Department
Admission: EM | Admit: 2017-12-06 | Discharge: 2017-12-06 | Discharge status: Against Medical Advice | Payer: Self-pay | Attending: Emergency Medicine | Admitting: Emergency Medicine

## 2017-12-06 ENCOUNTER — Encounter: Payer: Self-pay | Admitting: Emergency Medicine

## 2017-12-06 DIAGNOSIS — R300 Dysuria: Secondary | ICD-10-CM | POA: Insufficient documentation

## 2017-12-06 DIAGNOSIS — Z5321 Procedure and treatment not carried out due to patient leaving prior to being seen by health care provider: Secondary | ICD-10-CM | POA: Insufficient documentation

## 2017-12-06 LAB — BASIC METABOLIC PANEL
Anion gap: 9 (ref 5–15)
BUN: 15 mg/dL (ref 8–23)
CO2: 30 mmol/L (ref 22–32)
CREATININE: 1 mg/dL (ref 0.44–1.00)
Calcium: 9.3 mg/dL (ref 8.9–10.3)
Chloride: 99 mmol/L (ref 98–111)
GFR calc Af Amer: >60 mL/min (ref >60)
GFR calc non Af Amer: 60 mL/min — ABNORMAL LOW (ref >60)
GLUCOSE: 217 mg/dL — AB (ref 70–99)
POTASSIUM: 3.5 mmol/L (ref 3.5–5.1)
Sodium: 138 mmol/L (ref 135–145)

## 2017-12-06 LAB — CBC
HEMATOCRIT: 26.4 % — AB (ref 35.0–47.0)
Hemoglobin: 8.9 g/dL — ABNORMAL LOW (ref 12.0–16.0)
MCH: 26.8 pg (ref 26.0–34.0)
MCHC: 33.8 g/dL (ref 32.0–36.0)
MCV: 79.3 fL — ABNORMAL LOW (ref 80.0–100.0)
PLATELETS: 332 10*3/uL (ref 150–440)
RBC: 3.32 MIL/uL — ABNORMAL LOW (ref 3.80–5.20)
RDW: 15.2 % — AB (ref 11.5–14.5)
WBC: 5.1 10*3/uL (ref 3.6–11.0)

## 2017-12-06 LAB — URINALYSIS, COMPLETE (UACMP) WITH MICROSCOPIC
BILIRUBIN URINE: NEGATIVE
Bacteria, UA: NONE SEEN
GLUCOSE, UA: NEGATIVE mg/dL
HGB URINE DIPSTICK: NEGATIVE
KETONES UR: NEGATIVE mg/dL
NITRITE: NEGATIVE
PROTEIN: NEGATIVE mg/dL
Specific Gravity, Urine: 1.014 (ref 1.005–1.030)
pH: 6 (ref 5.0–8.0)

## 2017-12-06 NOTE — ED Triage Notes (Signed)
Patient to ER for suprapubic pain and intermittent dysuria for over one week. Was recently in hospital for disc replacement surgery and states she has had pain since leaving hospital. Patient also reports lower back pain. No known fevers.

## 2018-11-12 IMAGING — CR DG CHEST 2V
2 series · 2 of 2 positions shown · non-contrast
Comparison: 04/23/2012

CLINICAL DATA: Diffuse abdominal pain and chest pain. Small bowel
movement today but feels constipated. Symptoms started 3 days ago.
Vomiting today.

EXAM:
CHEST  2 VIEW

[chest pa]
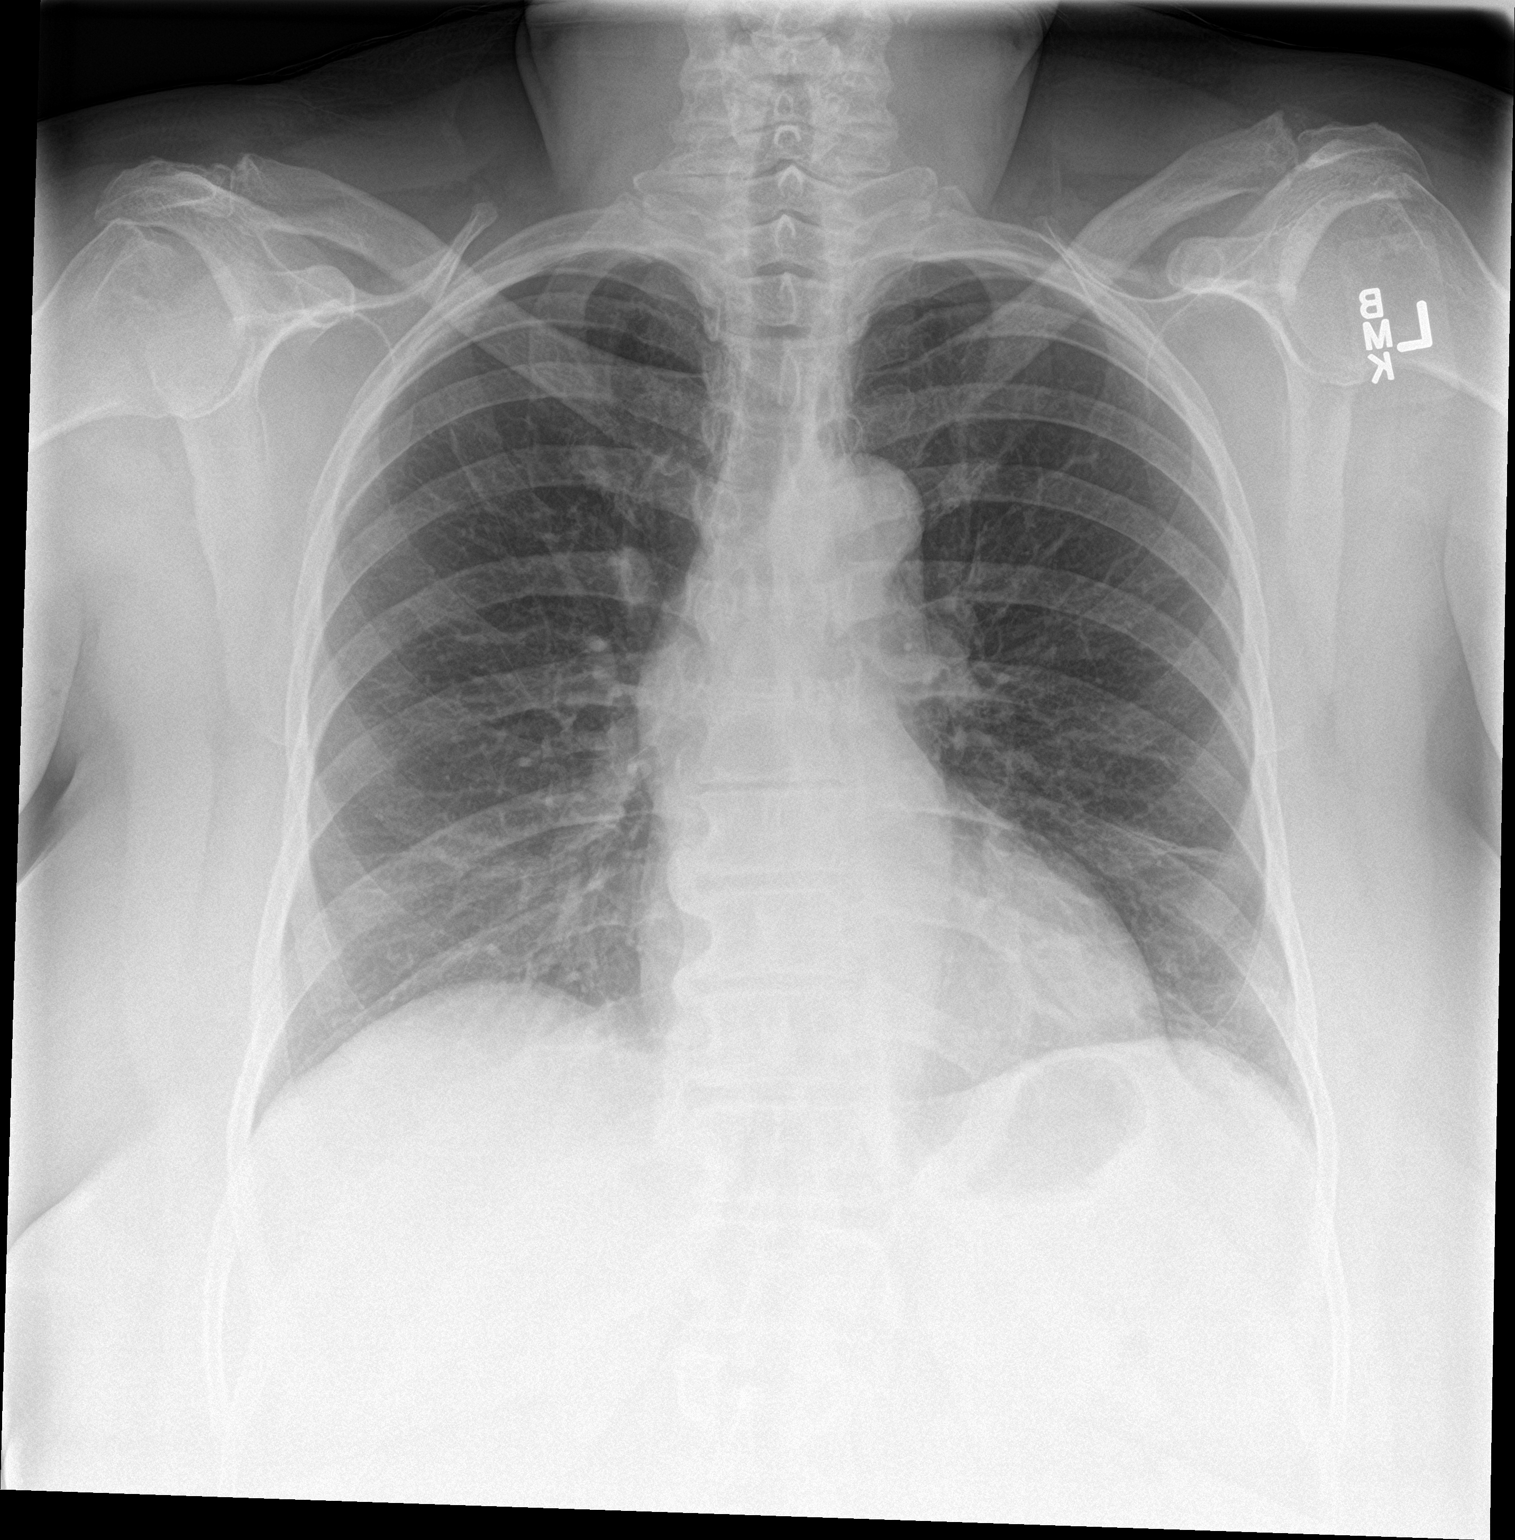

[chest lat]
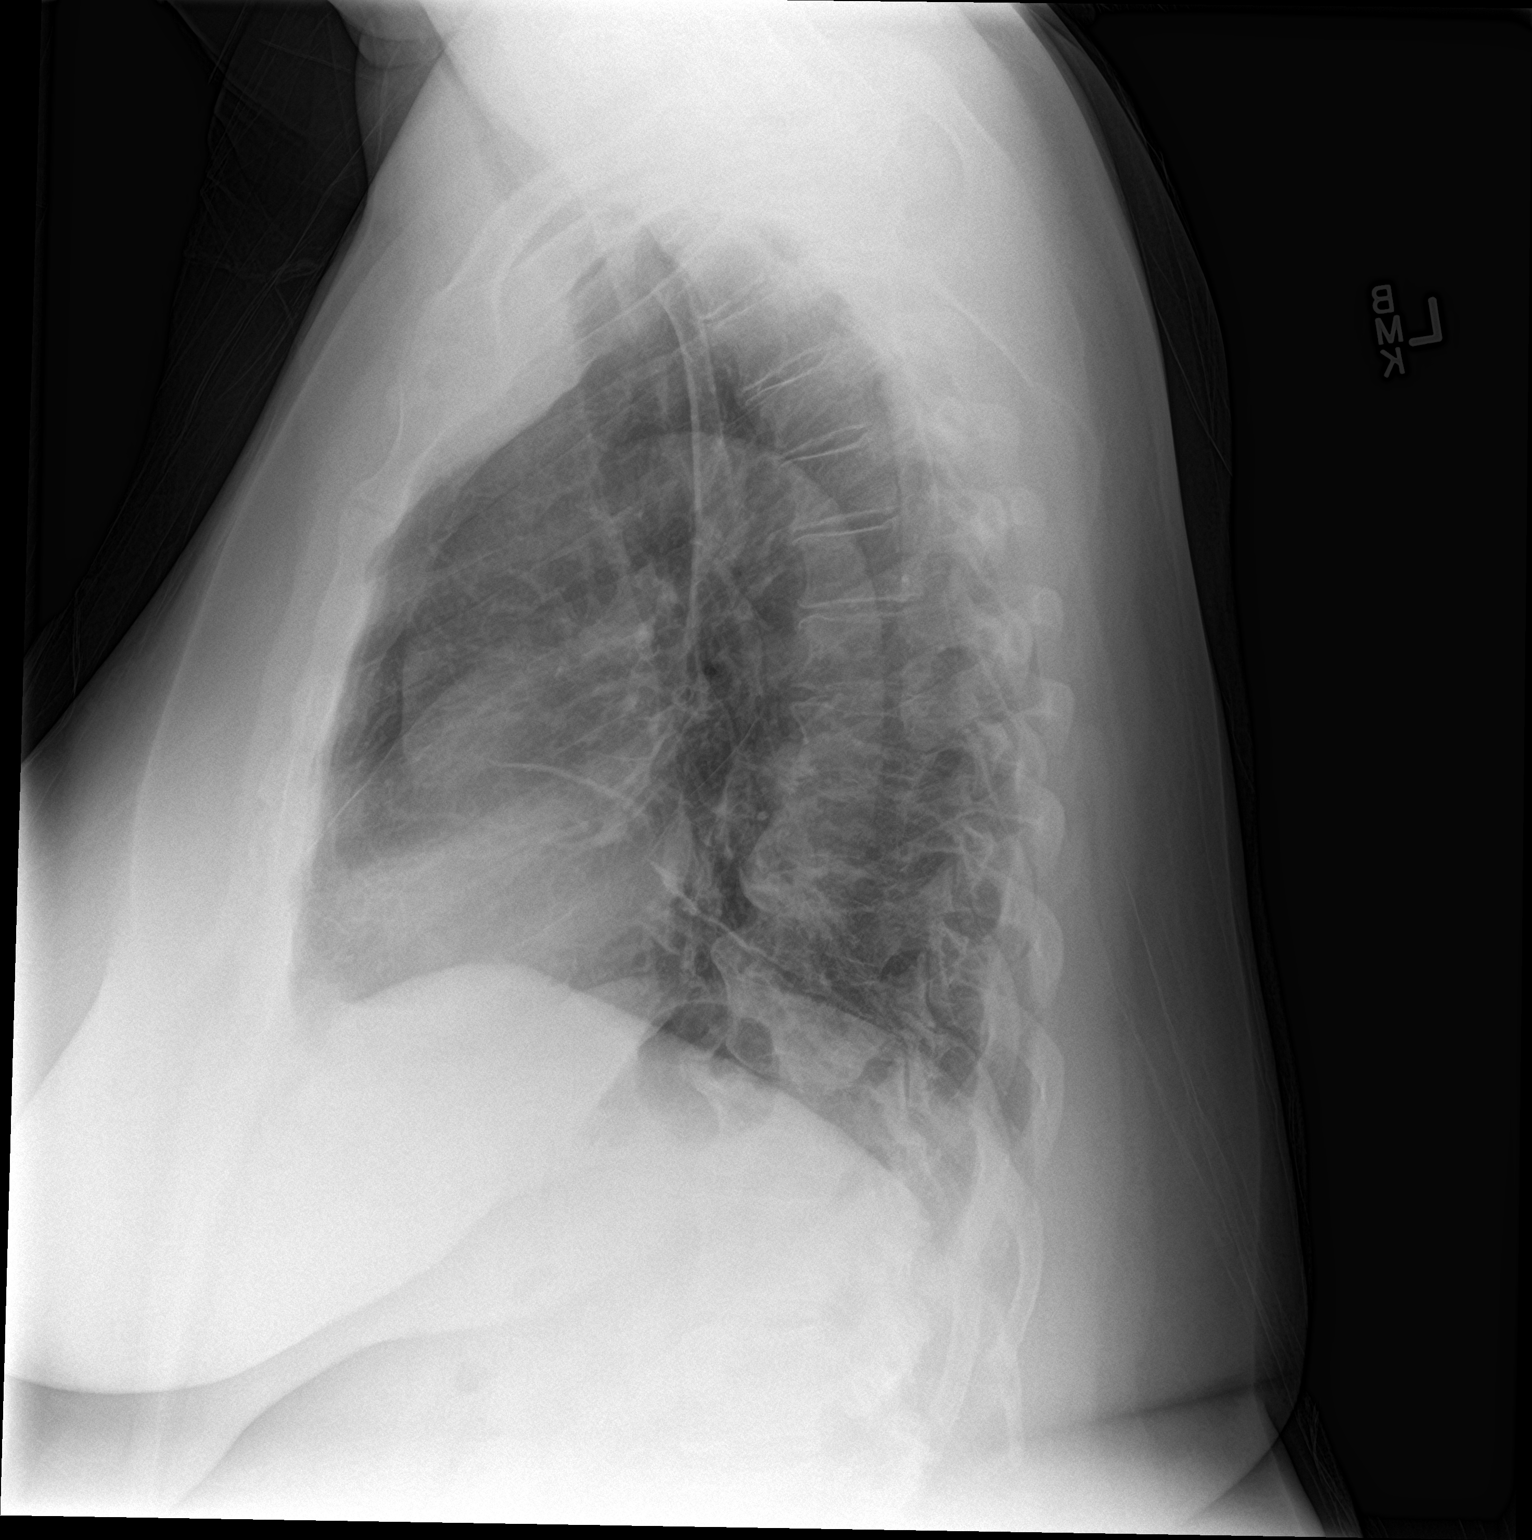

[2 of 2 positions shown; findings below may reference images not displayed]

FINDINGS: Normal heart size and pulmonary vascularity. Slight linear fibrosis
or atelectasis in the left mid and lower lung, similar to prior
study. No focal consolidation or airspace disease. No blunting of
costophrenic angles. No pneumothorax. Small esophageal hiatal hernia
suggested behind the heart. Calcified and tortuous aorta.
Degenerative changes in the spine and shoulders.
IMPRESSION: No evidence of active pulmonary disease. Linear fibrosis or
atelectasis in the left lung base.

## 2018-11-12 IMAGING — CT CT ABD-PELV W/ CM
2 of 5 series · 16 of 46 positions shown, 18 images · IV contrast (APPLIED)
Comparison: None.

CLINICAL DATA: Abdominal pain and bloating

EXAM:
CT ABDOMEN AND PELVIS WITH CONTRAST
TECHNIQUE: Multidetector CT imaging of the abdomen and pelvis was performed
using the standard protocol following bolus administration of
intravenous contrast.
CONTRAST:  100mL 99DRH8-9AA IOPAMIDOL (99DRH8-9AA) INJECTION 61%

[Series 2: routine abd/pel with · axial · 0.89mm/px · z∈[-482,-32]mm · 13 of 102 slices shown, 15 images]
[im 6/102  soft-tissue]
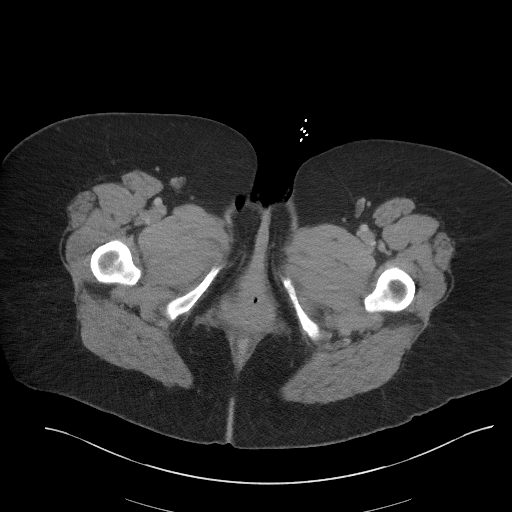
[im 6/102  bone]
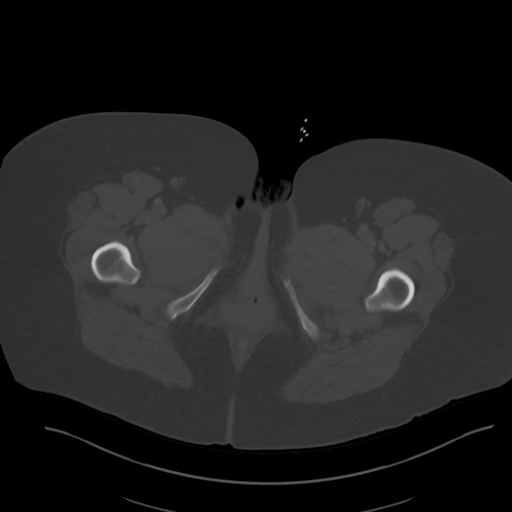
[im 12/102  soft-tissue]
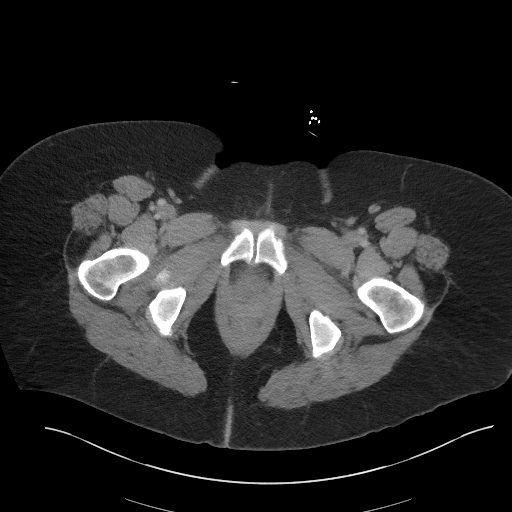
[im 23/102  soft-tissue]
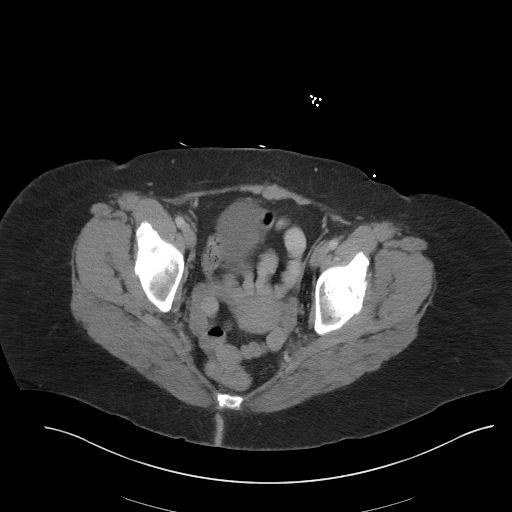
[im 29/102  soft-tissue]
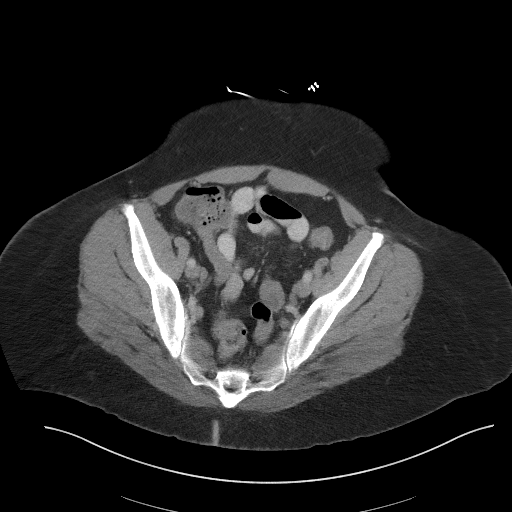
[im 34/102  soft-tissue]
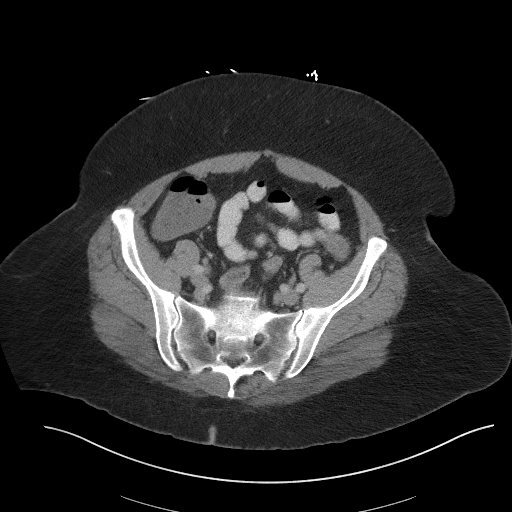
[im 45/102  soft-tissue]
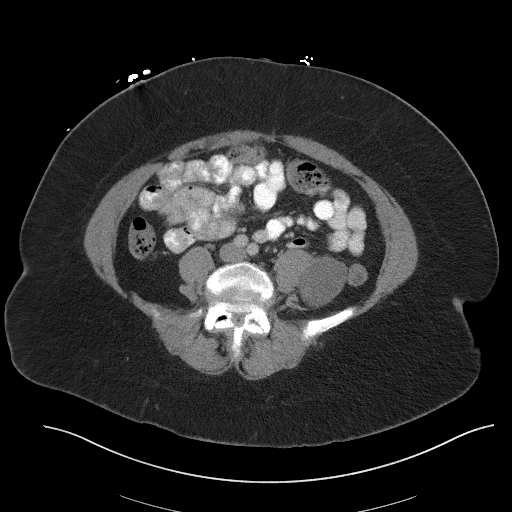
[im 51/102  soft-tissue]
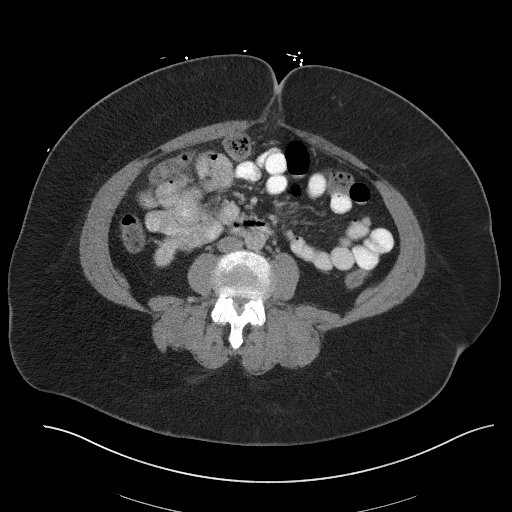
[im 57/102  soft-tissue]
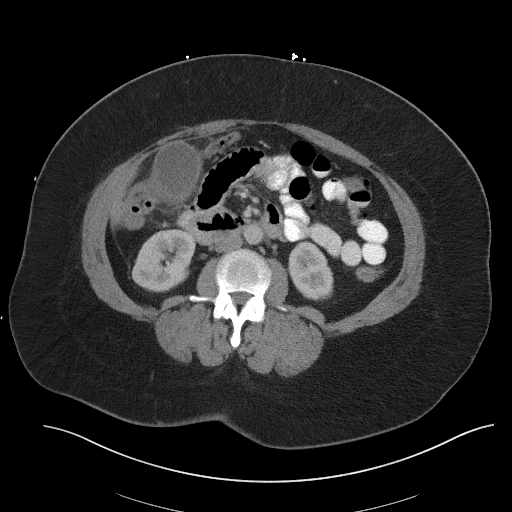
[im 68/102  soft-tissue]
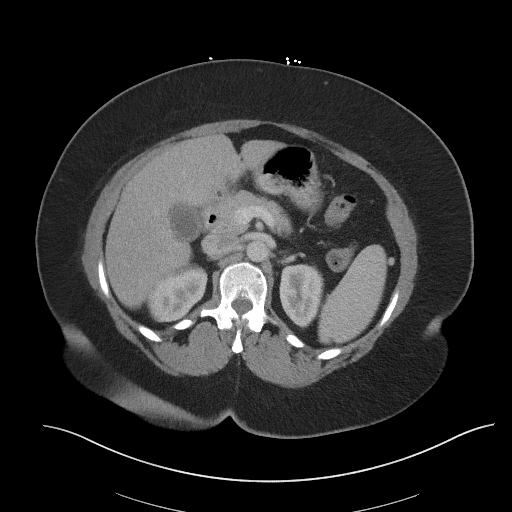
[im 68/102  bone]
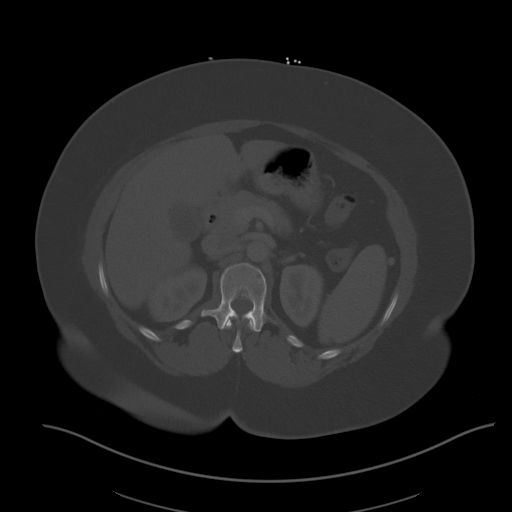
[im 73/102  soft-tissue]
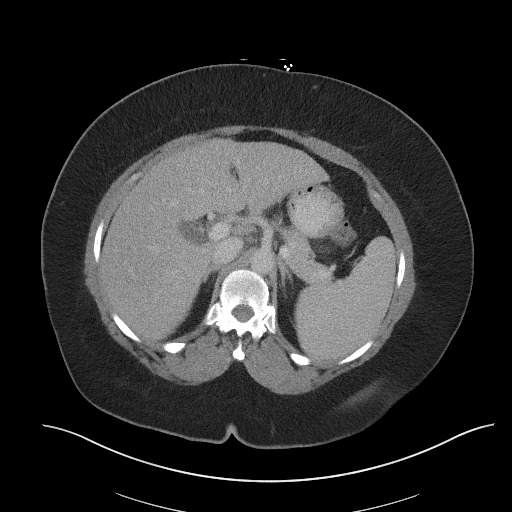
[im 79/102  soft-tissue]
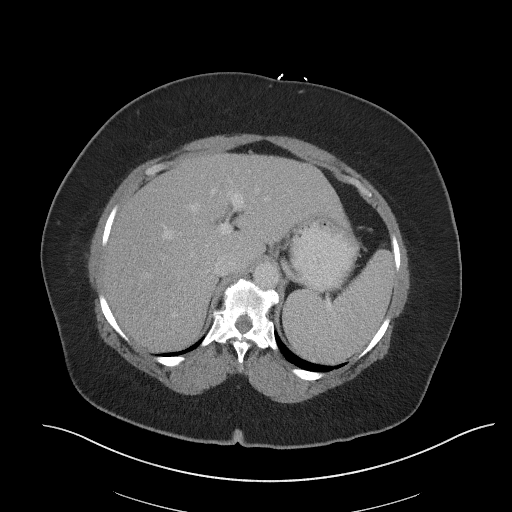
[im 90/102  soft-tissue]
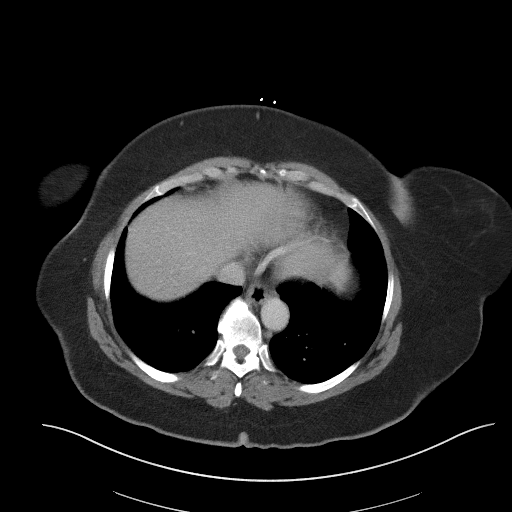
[im 96/102  soft-tissue]
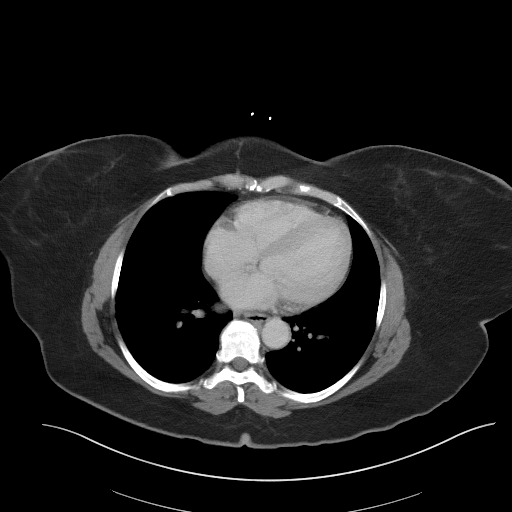

[Series 5: coronal st · coronal · 0.82mm/px · 3 of 94 slices shown]
[im 32/94  soft-tissue]
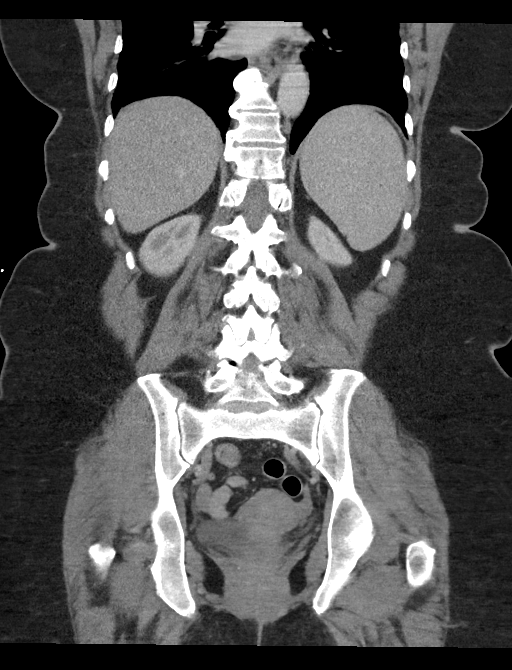
[im 42/94  soft-tissue]
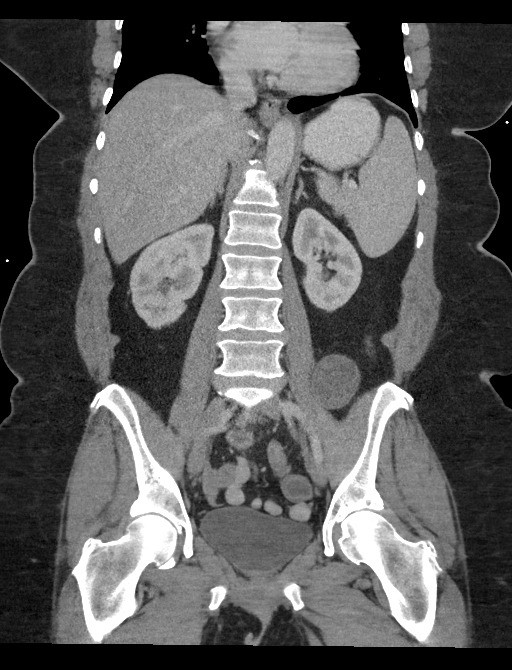
[im 52/94  soft-tissue]
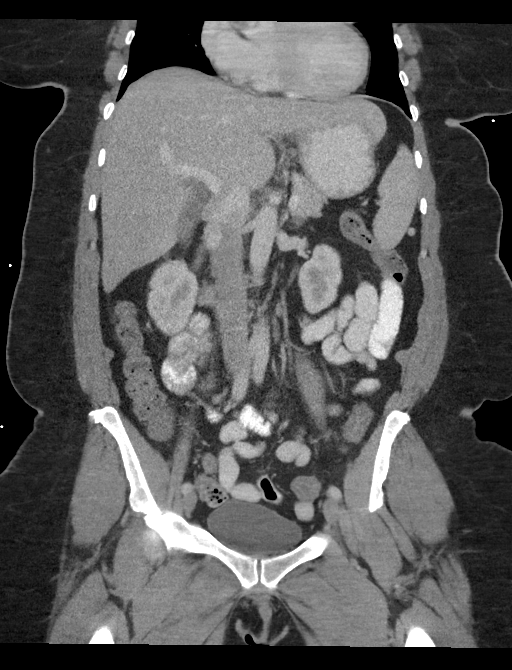

[16 of 46 positions shown; findings below may reference images not displayed]

FINDINGS: Lower chest: No acute abnormality.

Hepatobiliary: The liver is within normal limits. No biliary ductal
dilatation is seen. The gallbladder is well distended and
demonstrates changes of wall thickening suspicious for acute
cholecystitis.

Pancreas: Unremarkable. No pancreatic ductal dilatation or
surrounding inflammatory changes.

Spleen: Normal in size without focal abnormality.

Adrenals/Urinary Tract: Adrenal glands are unremarkable. Kidneys are
normal, without renal calculi, focal lesion, or hydronephrosis.
Bladder is unremarkable.

Stomach/Bowel: The appendix is not well visualized. No inflammatory
changes to suggest appendicitis are noted. No obstructive changes
are seen.

Vascular/Lymphatic: Aortic atherosclerosis. No enlarged abdominal or
pelvic lymph nodes.

Reproductive: Uterus and bilateral adnexa are unremarkable.

Other: No abdominal wall hernia or abnormality. No abdominopelvic
ascites.

Musculoskeletal: Degenerative changes of lumbar spine are noted.
IMPRESSION: Well distended gallbladder with gallbladder wall thickening and mild
pericholecystic inflammatory change suspicious for acute
cholecystitis. Ultrasound would be helpful for further evaluation.

## 2019-10-14 ENCOUNTER — Other Ambulatory Visit: Payer: Self-pay

## 2019-10-14 ENCOUNTER — Emergency Department
Admission: EM | Admit: 2019-10-14 | Discharge: 2019-10-14 | Disposition: A | Discharge status: Home / Self Care | Payer: Medicare Other | Attending: Emergency Medicine | Admitting: Emergency Medicine

## 2019-10-14 ENCOUNTER — Encounter: Payer: Self-pay | Admitting: Emergency Medicine

## 2019-10-14 DIAGNOSIS — Z5321 Procedure and treatment not carried out due to patient leaving prior to being seen by health care provider: Secondary | ICD-10-CM | POA: Diagnosis not present

## 2019-10-14 DIAGNOSIS — R519 Headache, unspecified: Secondary | ICD-10-CM | POA: Diagnosis present

## 2019-10-14 MED ORDER — ACETAMINOPHEN 325 MG PO TABS
650.0000 mg | ORAL_TABLET | Freq: Once | ORAL | Status: AC | PRN
  Administered 2019-10-14: 650 mg via ORAL
  Filled 2019-10-14: qty 2

## 2019-10-14 NOTE — ED Notes (Signed)
Patient decided not to stay.

## 2019-10-14 NOTE — ED Triage Notes (Signed)
Patient to ER for c/o headache. Patient states she has pain in several areas of her head since approx April. States she was scheduled to have sleep study, but study was postponed, that she does not sleep well. Reports headache feels worse today than it has been in the past.

## 2019-12-09 ENCOUNTER — Emergency Department: Payer: Medicare Other

## 2019-12-09 ENCOUNTER — Other Ambulatory Visit: Payer: Self-pay

## 2019-12-09 ENCOUNTER — Emergency Department
Admission: EM | Admit: 2019-12-09 | Discharge: 2019-12-09 | Disposition: A | Discharge status: Home / Self Care | Payer: Medicare Other | Attending: Emergency Medicine | Admitting: Emergency Medicine

## 2019-12-09 DIAGNOSIS — M791 Myalgia, unspecified site: Secondary | ICD-10-CM | POA: Diagnosis not present

## 2019-12-09 DIAGNOSIS — Z87891 Personal history of nicotine dependence: Secondary | ICD-10-CM | POA: Diagnosis not present

## 2019-12-09 DIAGNOSIS — Z79899 Other long term (current) drug therapy: Secondary | ICD-10-CM | POA: Diagnosis not present

## 2019-12-09 DIAGNOSIS — M7918 Myalgia, other site: Secondary | ICD-10-CM

## 2019-12-09 DIAGNOSIS — F329 Major depressive disorder, single episode, unspecified: Secondary | ICD-10-CM | POA: Insufficient documentation

## 2019-12-09 DIAGNOSIS — S199XXA Unspecified injury of neck, initial encounter: Secondary | ICD-10-CM | POA: Diagnosis present

## 2019-12-09 DIAGNOSIS — M25571 Pain in right ankle and joints of right foot: Secondary | ICD-10-CM | POA: Insufficient documentation

## 2019-12-09 DIAGNOSIS — F909 Attention-deficit hyperactivity disorder, unspecified type: Secondary | ICD-10-CM | POA: Insufficient documentation

## 2019-12-09 DIAGNOSIS — M25532 Pain in left wrist: Secondary | ICD-10-CM | POA: Insufficient documentation

## 2019-12-09 DIAGNOSIS — I10 Essential (primary) hypertension: Secondary | ICD-10-CM | POA: Insufficient documentation

## 2019-12-09 DIAGNOSIS — R519 Headache, unspecified: Secondary | ICD-10-CM | POA: Insufficient documentation

## 2019-12-09 DIAGNOSIS — J45909 Unspecified asthma, uncomplicated: Secondary | ICD-10-CM | POA: Insufficient documentation

## 2019-12-09 DIAGNOSIS — E119 Type 2 diabetes mellitus without complications: Secondary | ICD-10-CM | POA: Diagnosis not present

## 2019-12-09 DIAGNOSIS — Z7984 Long term (current) use of oral hypoglycemic drugs: Secondary | ICD-10-CM | POA: Diagnosis not present

## 2019-12-09 DIAGNOSIS — S161XXA Strain of muscle, fascia and tendon at neck level, initial encounter: Secondary | ICD-10-CM | POA: Diagnosis not present

## 2019-12-09 MED ORDER — HYDROMORPHONE HCL 1 MG/ML IJ SOLN
0.5000 mg | Freq: Once | INTRAMUSCULAR | Status: AC
  Administered 2019-12-09: 0.5 mg via INTRAMUSCULAR
  Filled 2019-12-09: qty 1

## 2019-12-09 MED ORDER — IBUPROFEN 600 MG PO TABS: 600.0000 mg | ORAL_TABLET | Freq: Three times a day (TID) | ORAL | 0 refills | Status: DC | PRN

## 2019-12-09 MED ORDER — ORPHENADRINE CITRATE 30 MG/ML IJ SOLN
60.0000 mg | Freq: Two times a day (BID) | INTRAMUSCULAR | Status: DC
  Administered 2019-12-09: 60 mg via INTRAMUSCULAR
  Filled 2019-12-09: qty 2

## 2019-12-09 MED ORDER — OXYCODONE-ACETAMINOPHEN 7.5-325 MG PO TABS: 1.0000 | ORAL_TABLET | Freq: Four times a day (QID) | ORAL | 0 refills | Status: DC | PRN

## 2019-12-09 MED ORDER — CYCLOBENZAPRINE HCL 10 MG PO TABS: 10.0000 mg | ORAL_TABLET | Freq: Three times a day (TID) | ORAL | 0 refills | Status: DC | PRN
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
## 2019-12-09 NOTE — ED Notes (Signed)
E-signature not working at this time. Pt verbalized understanding of D/C instructions, prescriptions and follow up care with no further questions at this time. Pt in NAD and ambulatory at time of D/C.  
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
## 2021-09-17 ENCOUNTER — Other Ambulatory Visit: Payer: Self-pay

## 2021-09-17 ENCOUNTER — Emergency Department: Payer: Medicare HMO

## 2021-09-17 ENCOUNTER — Emergency Department
Admission: EM | Admit: 2021-09-17 | Discharge: 2021-09-17 | Disposition: A | Discharge status: Home / Self Care | Payer: Medicare HMO | Attending: Emergency Medicine | Admitting: Emergency Medicine

## 2021-09-17 DIAGNOSIS — I10 Essential (primary) hypertension: Secondary | ICD-10-CM | POA: Diagnosis not present

## 2021-09-17 DIAGNOSIS — R0789 Other chest pain: Secondary | ICD-10-CM | POA: Insufficient documentation

## 2021-09-17 DIAGNOSIS — E119 Type 2 diabetes mellitus without complications: Secondary | ICD-10-CM | POA: Insufficient documentation

## 2021-09-17 DIAGNOSIS — R079 Chest pain, unspecified: Secondary | ICD-10-CM | POA: Diagnosis present

## 2021-09-17 DIAGNOSIS — M791 Myalgia, unspecified site: Secondary | ICD-10-CM

## 2021-09-17 LAB — BASIC METABOLIC PANEL
Anion gap: 4 — ABNORMAL LOW (ref 5–15)
BUN: 17 mg/dL (ref 8–23)
CO2: 27 mmol/L (ref 22–32)
Calcium: 9.4 mg/dL (ref 8.9–10.3)
Chloride: 108 mmol/L (ref 98–111)
Creatinine, Ser: 1.04 mg/dL — ABNORMAL HIGH (ref 0.44–1.00)
GFR, Estimated: 60 mL/min — ABNORMAL LOW (ref >60)
Glucose, Bld: 131 mg/dL — ABNORMAL HIGH (ref 70–99)
Potassium: 4 mmol/L (ref 3.5–5.1)
Sodium: 139 mmol/L (ref 135–145)

## 2021-09-17 LAB — CBC
HCT: 37.2 % (ref 36.0–46.0)
Hemoglobin: 11.9 g/dL — ABNORMAL LOW (ref 12.0–15.0)
MCH: 26.9 pg (ref 26.0–34.0)
MCHC: 32 g/dL (ref 30.0–36.0)
MCV: 84.2 fL (ref 80.0–100.0)
Platelets: 272 10*3/uL (ref 150–400)
RBC: 4.42 MIL/uL (ref 3.87–5.11)
RDW: 13.7 % (ref 11.5–15.5)
WBC: 5.8 10*3/uL (ref 4.0–10.5)
nRBC: 0 % (ref 0.0–0.2)

## 2021-09-17 LAB — TROPONIN I (HIGH SENSITIVITY): Troponin I (High Sensitivity): 7 ng/L (ref <18)

## 2021-09-17 MED ORDER — DICLOFENAC SODIUM 1 % EX GEL: 4.0000 g | Freq: Three times a day (TID) | CUTANEOUS | 0 refills | Status: AC

## 2021-09-17 MED ORDER — DICLOFENAC SODIUM 1 % EX GEL
4.0000 g | Freq: Three times a day (TID) | CUTANEOUS | 0 refills | Status: DC
Start: 2021-09-17 — End: 2021-09-17
  Filled 2021-09-17: qty 100, fill #0

## 2021-09-17 MED ORDER — CYCLOBENZAPRINE HCL 10 MG PO TABS
10.0000 mg | ORAL_TABLET | Freq: Three times a day (TID) | ORAL | 0 refills | Status: DC | PRN
  Filled 2021-09-17: qty 30, 10d supply, fill #0

## 2021-09-17 MED ORDER — CYCLOBENZAPRINE HCL 10 MG PO TABS: 10.0000 mg | ORAL_TABLET | Freq: Three times a day (TID) | ORAL | 0 refills | Status: DC | PRN

## 2021-09-17 NOTE — Discharge Instructions (Signed)
Pay attention to how you are sleeping, and I recommend trying to sleep on your back in a bed with good positioning.  You can use the Voltaren gel, which can also be purchased over-the-counter, to help with pain.  He can also try heat as well as local lidocaine patches.  Try to avoid any heavy lifting or twisting, particularly with the right arm, for the next week.  You can take over-the-counter Tylenol and Advil for pain as well.

## 2021-09-17 NOTE — ED Triage Notes (Signed)
Patient to ER via POV from home. Reports chest pain that started today that woke her from her sleep at 5am. Describes it as heavy, centralized and radiates down into right arm, making her hand numb. States history of one similar episode. History of Htn but no cardiac history.

## 2021-09-17 NOTE — ED Provider Notes (Signed)
[facility] Provider Note    Event Date/Time   First MD Initiated Contact with Patient 09/17/21 1130     (approximate)   History   Chest Pain   HPI  Maria Conway is a 66 y.o. female here with right upper chest and arm pain/tingling.  The patient states that she woke up from sleep last night with severe, throbbing, right upper chest pain that radiated down towards her arm with some associated tingling type pain.  It was somewhat positional.  It lasted about 15 to 20 minutes and has now improved.  She does have some positional worsening of it.  Reports history of similar symptoms 1 or 2 months ago, that then resolved.  No history of coronary disease.  Denies any shortness of breath or diaphoresis.  No nausea or vomiting.  She is status post cholecystectomy.  Of note, she did recently switch her sleeping position and she had to spray her couch for nights, and sleeps on her right side.  She sleeps on a couch rather than the bed.  No other complaints.  No recent falls or injuries.  No persistent numbness or weakness of the arms.     Physical Exam   Triage Vital Signs: ED Triage Vitals [09/17/21 1107]  Enc Vitals Group     BP (!) 160/89     Pulse Rate 80     Resp 17     Temp 98.3 F (36.8 C)     Temp Source Oral     SpO2 99 %     Weight 230 lb (104.3 kg)     Height 5\' 5"  (1.651 m)     Head Circumference      Peak Flow      Pain Score 0     Pain Loc      Pain Edu?      Excl. in GC?     Most recent vital signs: Vitals:   09/17/21 1107 09/17/21 1123  BP: (!) 160/89 (!) 141/88  Pulse: 80 80  Resp: 17 18  Temp: 98.3 F (36.8 C)   SpO2: 99% 97%     General: Awake, no distress.  CV:  Good peripheral perfusion.  Resp:  Normal effort.  Regular rate and rhythm.  No murmurs. Abd:  No distention.  No tenderness. Other:  Moderate right paraspinal tenderness to palpation, with some reproduction of symptoms with Spurling's test on the right.  No skin  lesions.  Motor strength 5-5 bilateral upper extremities.  Normal sensation light touch.   ED Results / Procedures / Treatments   Labs (all labs ordered are listed, but only abnormal results are displayed) Labs Reviewed  BASIC METABOLIC PANEL - Abnormal; Notable for the following components:      Result Value   Glucose, Bld 131 (*)    Creatinine, Ser 1.04 (*)    GFR, Estimated 60 (*)    Anion gap 4 (*)    All other components within normal limits  CBC - Abnormal; Notable for the following components:   Hemoglobin 11.9 (*)    All other components within normal limits  TROPONIN I (HIGH SENSITIVITY)     EKG Normal sinus rhythm, ventricular at 75.  PR 142, QRS 84, QTc 437.  No acute ST elevations or depressions.  No acute events of acute ischemia or infarct.   RADIOLOGY Chest x-ray: Clear, no active disease   I also independently reviewed and agree with radiologist interpretations.   PROCEDURES:  Critical Care performed: No     MEDICATIONS ORDERED IN ED: Medications - No data to display   IMPRESSION / MDM / ASSESSMENT AND PLAN / ED COURSE  I reviewed the triage vital signs and the nursing notes.                              Ddx:  Differential includes the following, with pertinent life- or limb-threatening emergencies considered:  Cervical radiculopathy/upper trapezius/musculoskeletal pain, atypical chest pain, pneumonia, pneumothorax, ACS, less likely PE or dissection  Patient's presentation is most consistent with acute presentation with potential threat to life or bodily function.  MDM:  66 year old female with past medical history of diabetes, hypertension, chronic pain, here with right upper chest/neck pain radiating towards her arm.  Symptoms are now resolved.  EKG is nonischemic and troponin is negative despite symptoms several hours ago, do not suspect ACS.  The pain is somewhat reproducible on exam and correlates with when she recently switched sleeping  positions, and I suspect this is contributing to her symptoms.  Suspect possible cervical radiculopathy and she does have a known history of cervical radicular disease as well.  No signs of neurological compromise.  Distal perfusion is intact.  Pain is not consistent with or concerning for dissection or PE.  Chest x-ray is clear.  Will advise topical treatment as she would like to avoid systemic anti-inflammatories, and encouraged on appropriate sleep hygiene and sleep positioning.  Return precautions given.  No other apparent emergent medical pathology.  Abdomen is soft and nontender, do not suspect referred GI pain.   MEDICATIONS GIVEN IN ED: Medications - No data to display   Consults:     EMR reviewed  Reviewed prior psychiatry visits at [facility] for TMS, chronic depression noted, chronic pain noted     FINAL CLINICAL IMPRESSION(S) / ED DIAGNOSES   Final diagnoses:  Atypical chest pain  Muscular pain     Rx / DC Orders   ED Discharge Orders          Ordered    cyclobenzaprine (FLEXERIL) 10 MG tablet  3 times daily PRN,   Status:  Discontinued        09/17/21 1248    diclofenac Sodium (VOLTAREN) 1 % GEL  3 times daily,   Status:  Discontinued        09/17/21 1248    cyclobenzaprine (FLEXERIL) 10 MG tablet  3 times daily PRN        09/17/21 1303    diclofenac Sodium (VOLTAREN) 1 % GEL  3 times daily        09/17/21 1303             Note:  This document was prepared using Dragon voice recognition software and may include unintentional dictation errors.   Shaune Pollack, MD 09/17/21 2100

## 2021-09-18 ENCOUNTER — Other Ambulatory Visit: Payer: Self-pay

## 2022-01-17 ENCOUNTER — Telehealth: Payer: Self-pay

## 2022-01-17 NOTE — Telephone Encounter (Signed)
Left vm and sent mychart message to confirm 01/23/22 appointment-Toni 

## 2022-01-23 ENCOUNTER — Ambulatory Visit: Payer: Medicare HMO | Admitting: Nurse Practitioner

## 2022-01-30 ENCOUNTER — Telehealth: Payer: Self-pay | Admitting: Internal Medicine

## 2022-01-30 NOTE — Telephone Encounter (Signed)
Patient lvm during lunch requesting call back to see when her appointment was. I lvm notifying her that her appointment was 01/23/22 and since she missed, she has been discharged from practice-Toni

## 2022-09-15 ENCOUNTER — Ambulatory Visit
Admission: RE | Admit: 2022-09-15 | Discharge: 2022-09-15 | Disposition: A | Discharge status: Home / Self Care | Payer: Medicare Other | Source: Ambulatory Visit | Attending: Family Medicine | Admitting: Family Medicine

## 2022-09-15 ENCOUNTER — Other Ambulatory Visit: Payer: Self-pay | Admitting: Family Medicine

## 2022-09-15 DIAGNOSIS — R053 Chronic cough: Secondary | ICD-10-CM

## 2022-11-30 ENCOUNTER — Other Ambulatory Visit: Payer: Self-pay

## 2022-11-30 ENCOUNTER — Emergency Department: Payer: Medicare Other

## 2022-11-30 ENCOUNTER — Emergency Department
Admission: EM | Admit: 2022-11-30 | Discharge: 2022-11-30 | Disposition: A | Discharge status: Home / Self Care | Payer: Medicare Other | Attending: Emergency Medicine | Admitting: Emergency Medicine

## 2022-11-30 DIAGNOSIS — R0789 Other chest pain: Secondary | ICD-10-CM

## 2022-11-30 DIAGNOSIS — M25552 Pain in left hip: Secondary | ICD-10-CM | POA: Diagnosis not present

## 2022-11-30 DIAGNOSIS — W07XXXA Fall from chair, initial encounter: Secondary | ICD-10-CM | POA: Diagnosis not present

## 2022-11-30 DIAGNOSIS — M25512 Pain in left shoulder: Secondary | ICD-10-CM

## 2022-11-30 DIAGNOSIS — M542 Cervicalgia: Secondary | ICD-10-CM | POA: Diagnosis not present

## 2022-11-30 DIAGNOSIS — R079 Chest pain, unspecified: Secondary | ICD-10-CM | POA: Diagnosis present

## 2022-11-30 LAB — CBC WITH DIFFERENTIAL/PLATELET
Abs Immature Granulocytes: 0 10*3/uL (ref 0.00–0.07)
Basophils Absolute: 0.1 10*3/uL (ref 0.0–0.1)
Basophils Relative: 1 %
Eosinophils Absolute: 0.3 10*3/uL (ref 0.0–0.5)
Eosinophils Relative: 4 %
HCT: 38.2 % (ref 36.0–46.0)
Hemoglobin: 11.9 g/dL — ABNORMAL LOW (ref 12.0–15.0)
Immature Granulocytes: 0 %
Lymphocytes Relative: 40 %
Lymphs Abs: 2.6 10*3/uL (ref 0.7–4.0)
MCH: 26.5 pg (ref 26.0–34.0)
MCHC: 31.2 g/dL (ref 30.0–36.0)
MCV: 85.1 fL (ref 80.0–100.0)
Monocytes Absolute: 0.3 10*3/uL (ref 0.1–1.0)
Monocytes Relative: 5 %
Neutro Abs: 3.2 10*3/uL (ref 1.7–7.7)
Neutrophils Relative %: 50 %
Platelets: 244 10*3/uL (ref 150–400)
RBC: 4.49 MIL/uL (ref 3.87–5.11)
RDW: 14.6 % (ref 11.5–15.5)
WBC: 6.4 10*3/uL (ref 4.0–10.5)
nRBC: 0 % (ref 0.0–0.2)

## 2022-11-30 LAB — BASIC METABOLIC PANEL
Anion gap: 15 (ref 5–15)
BUN: 18 mg/dL (ref 8–23)
CO2: 23 mmol/L (ref 22–32)
Calcium: 9.5 mg/dL (ref 8.9–10.3)
Chloride: 102 mmol/L (ref 98–111)
Creatinine, Ser: 1.28 mg/dL — ABNORMAL HIGH (ref 0.44–1.00)
GFR, Estimated: 46 mL/min — ABNORMAL LOW (ref >60)
Glucose, Bld: 79 mg/dL (ref 70–99)
Potassium: 3.6 mmol/L (ref 3.5–5.1)
Sodium: 140 mmol/L (ref 135–145)

## 2022-11-30 MED ORDER — IOHEXOL 350 MG/ML SOLN
75.0000 mL | Freq: Once | INTRAVENOUS | Status: AC | PRN
  Administered 2022-11-30: 75 mL via INTRAVENOUS

## 2022-11-30 MED ORDER — MORPHINE SULFATE (PF) 4 MG/ML IV SOLN
4.0000 mg | Freq: Once | INTRAVENOUS | Status: AC
  Administered 2022-11-30: 4 mg via INTRAVENOUS
  Filled 2022-11-30: qty 1

## 2022-11-30 MED ORDER — HYDROCODONE-ACETAMINOPHEN 5-325 MG PO TABS
1.0000 | ORAL_TABLET | Freq: Once | ORAL | Status: AC
  Administered 2022-11-30: 1 via ORAL
  Filled 2022-11-30: qty 1

## 2022-11-30 NOTE — ED Notes (Signed)
Patient discharged at this time. Ambulated to lobby with independent and steady gait. Breathing unlabored speaking in full sentences. Verbalized understanding of all discharge, follow up, and medication teaching. Discharged homed with all belongings.   

## 2022-11-30 NOTE — ED Triage Notes (Signed)
Mechanical fall into home, patient tripped over raised floor and fell face first into a wooden chair; She states that the chair hit her in the throat (patient is able to speak in full sentences and does appear to be in any respiratory distress); She is not on any blood thinners and denies loss of consciousness

## 2022-11-30 NOTE — ED Notes (Signed)
RN introduced self to patient. Pt denies needs at this time. Patient resting in bed free from sign of distress. Breathing unlabored speaking in full sentences with symmetric chest rise and fall. Bed low and locked with side rails raised x2. Call bell in reach.

## 2022-11-30 NOTE — Discharge Instructions (Addendum)
You were seen in the ER today for evaluation after a fall.  Fortunately we did not find any serious injuries.  Follow with your primary care doctor as needed.  Return to the ER for new or worsening symptoms.

## 2022-11-30 NOTE — ED Provider Notes (Signed)
[facility] Provider Note    Event Date/Time   First MD Initiated Contact with Patient 11/30/22 1834     (approximate)   History   Fall (Mechanical fall into home, patient tripped over raised floor and fell face first into a wooden chair; She states that the chair hit her in the throat (patient is able to speak in full sentences and does appear to be in any respiratory distress); She is not on any blood thinners and denies loss of consciousness)   HPI  Maria Conway is a 67 year old female presenting to the emerged from for evaluation after a fall.  Patient was carrying groceries and when she tripped over a uneven portion of her floor and fell into a wooden chair.  Chair hit her neck and she rolled onto the ground.  Denies significant head injury.  No loss of consciousness.  Not on anticoagulation.  Has been ambulatory since that time.  Initially noted pain primarily in her left anterior neck, chest wall, and left shoulder, but now reports some pain in her left hip as well.  No numbness, tingling, focal weakness.  No chest pain or shortness of breath.  No changes in voice or difficulty swallowing.     Physical Exam   Triage Vital Signs: ED Triage Vitals  Encounter Vitals Group     BP 11/30/22 1741 (!) 154/80     Systolic BP Percentile --      Diastolic BP Percentile --      Pulse Rate 11/30/22 1741 86     Resp 11/30/22 1741 18     Temp 11/30/22 1741 98.2 F (36.8 C)     Temp Source 11/30/22 1741 Oral     SpO2 11/30/22 1741 100 %     Weight 11/30/22 1746 232 lb (105.2 kg)     Height 11/30/22 1746 5\' 5"  (1.651 m)     Head Circumference --      Peak Flow --      Pain Score 11/30/22 2115 4     Pain Loc --      Pain Education --      Exclude from Growth Chart --     Most recent vital signs: Vitals:   11/30/22 1741 11/30/22 2109  BP: (!) 154/80 (!) 145/88  Pulse: 86 81  Resp: 18 18  Temp: 98.2 F (36.8 C)   SpO2: 100% 99%   Nursing notes and  vital signs reviewed.  General: Adult female, laying in bed, awake, reactive Head: Atraumatic Neck: There is tenderness to palpation over the left anterolateral neck.  No bruising over this area, but there is some bruising over the chest wall just inferior to this.  No midline spinal tenderness.  Placed in cervical collar from triage, reports this provides comfort of her anterior neck so this was left in place  Chest: Symmetric chest rise, tenderness with patient over the left upper chest wall without palpable deformity  Cardiac: Regular rhythm and rate.  Respiratory: Lungs clear to auscultation Abdomen: Soft, nondistended. No tenderness to palpation.  Pelvis: Stable in AP and lateral compression.  Some tenderness to palpation over the left hip. MSK: No deformity to bilateral upper and lower extremity. Neuro: Alert, oriented. GCS 15. 5 out of 5 strength in bilateral upper and lower extremities. Normal sensation to light touch in bilateral upper and lower extremity. Skin: No evidence of burns or lacerations.  ED Results / Procedures / Treatments   Labs (all labs ordered  are listed, but only abnormal results are displayed) Labs Reviewed  CBC WITH DIFFERENTIAL/PLATELET - Abnormal; Notable for the following components:      Result Value   Hemoglobin 11.9 (*)    All other components within normal limits  BASIC METABOLIC PANEL - Abnormal; Notable for the following components:   Creatinine, Ser 1.28 (*)    GFR, Estimated 46 (*)    All other components within normal limits     EKG EKG independently reviewed interpreted by myself (ER attending) demonstrates:    RADIOLOGY Imaging independently reviewed and interpreted by myself demonstrates:  CXR without focal consolidation or visible rib fracture Shoulder x-Sayf Kerner without acute fracture Hip x-Elier Zellars without acute fracture CTA of the head and neck without evidence of vascular injury or other acute finding  PROCEDURES:  Critical Care  performed: No  Procedures   MEDICATIONS ORDERED IN ED: Medications  HYDROcodone-acetaminophen (NORCO/VICODIN) 5-325 MG per tablet 1 tablet (1 tablet Oral Given 11/30/22 1905)  morphine (PF) 4 MG/ML injection 4 mg (4 mg Intravenous Given 11/30/22 2045)  iohexol (OMNIPAQUE) 350 MG/ML injection 75 mL (75 mLs Intravenous Contrast Given 11/30/22 2035)     IMPRESSION / MDM / ASSESSMENT AND PLAN / ED COURSE  I reviewed the triage vital signs and the nursing notes.  Differential diagnosis includes, but is not limited to, cervical artery injury, rib fracture, shoulder fracture, hip fracture, soft tissue injury  Patient's presentation is most consistent with acute presentation with potential threat to life or bodily function.  67 year old female presenting after a fall with multiple areas of pain.  Plain films are reassuring.  Given tenderness over anterior lateral neck including along the course of the carotid artery, CTA was ordered which fortunately was without evidence of vascular injury.  Patient was ordered for pain control.  On reevaluation she reports improvement in her symptoms.  She is comfortable with discharge home.  Strict return precautions provided.  Patient discharged in stable condition.       FINAL CLINICAL IMPRESSION(S) / ED DIAGNOSES   Final diagnoses:  Neck pain  Acute pain of left shoulder  Chest wall pain  Pain of left hip     Rx / DC Orders   ED Discharge Orders     None        Note:  This document was prepared using Dragon voice recognition software and may include unintentional dictation errors.   [name], MD 11/30/22 [phone]

## 2023-02-05 ENCOUNTER — Other Ambulatory Visit: Payer: Self-pay | Admitting: Family Medicine

## 2023-02-05 DIAGNOSIS — Z13820 Encounter for screening for osteoporosis: Secondary | ICD-10-CM

## 2023-02-05 DIAGNOSIS — Z1231 Encounter for screening mammogram for malignant neoplasm of breast: Secondary | ICD-10-CM

## 2023-02-08 ENCOUNTER — Other Ambulatory Visit: Payer: Self-pay

## 2023-02-08 ENCOUNTER — Telehealth: Payer: Self-pay

## 2023-02-08 DIAGNOSIS — Z1211 Encounter for screening for malignant neoplasm of colon: Secondary | ICD-10-CM

## 2023-02-08 MED ORDER — NA SULFATE-K SULFATE-MG SULF 17.5-3.13-1.6 GM/177ML PO SOLN: 1.0000 | Freq: Once | ORAL | 0 refills | Status: AC

## 2023-02-08 NOTE — Telephone Encounter (Signed)
Gastroenterology Pre-Procedure Review  Request Date: 03/20/23 Requesting Physician: Dr. Allegra Lai  PATIENT REVIEW QUESTIONS: The patient responded to the following health history questions as indicated:    1. Are you having any GI issues? no 2. Do you have a personal history of Polyps? yes (Last colonoscopy not able to locate in chart but patient thinks there were some polyps on the last one unsure of the year) 3. Do you have a family history of Colon Cancer or Polyps? no 4. Diabetes Mellitus? yes (Pt is diabetic.  Has been advised to stop mounjaro (7) days prior to colonoscopy.  Stop Metformin (2) days prior to colonoscopy) 5. Joint replacements in the past 12 months?no 6. Major health problems in the past 3 months?no 7. Any artificial heart valves, MVP, or defibrillator?no    MEDICATIONS & ALLERGIES:    Patient reports the following regarding taking any anticoagulation/antiplatelet therapy:   Plavix, Coumadin, Eliquis, Xarelto, Lovenox, Pradaxa, Brilinta, or Effient? no Aspirin? no  Patient confirms/reports the following medications:  Current Outpatient Medications  Medication Sig Dispense Refill   albuterol (PROVENTIL HFA;VENTOLIN HFA) 108 (90 Base) MCG/ACT inhaler Inhale 2 puffs into the lungs every 4 (four) hours as needed for wheezing or shortness of breath.     atorvastatin (LIPITOR) 20 MG tablet Take 20 mg by mouth daily.     buPROPion (WELLBUTRIN XL) 150 MG 24 hr tablet Take 150 mg by mouth daily.     diclofenac Sodium (VOLTAREN) 1 % GEL Apply 4 g topically in the morning, at noon, and at bedtime. 100 g 0   gabapentin (NEURONTIN) 800 MG tablet Take 800 mg by mouth 3 (three) times daily as needed.     ibuprofen (ADVIL) 600 MG tablet Take 1 tablet (600 mg total) by mouth every 8 (eight) hours as needed. 15 tablet 0   lisdexamfetamine (VYVANSE) 30 MG capsule Take 30 mg by mouth daily.     metFORMIN (GLUCOPHAGE-XR) 500 MG 24 hr tablet Take 2,000 mg by mouth daily.      montelukast  (SINGULAIR) 10 MG tablet Take 10 mg by mouth daily.     MOUNJARO 15 MG/0.5ML Pen SMARTSIG:1 SUB-Q Once a Week     Olmesartan-amLODIPine-HCTZ 40-10-25 MG TABS Take 1 tablet by mouth every morning.     omeprazole (PRILOSEC) 20 MG capsule Take 1 capsule (20 mg total) by mouth daily. 30 capsule 3   prazosin (MINIPRESS) 1 MG capsule Take by mouth.     sertraline (ZOLOFT) 100 MG tablet Take 250 mg by mouth daily.     No current facility-administered medications for this visit.    Patient confirms/reports the following allergies:  Allergies  Allergen Reactions   Aspartame And Phenylalanine Other (See Comments) and Cough    Reaction: throat irritation   Lisinopril Cough    Other reaction(s): Cough   Phenylalanine Other (See Comments)    Reaction: throat irritation    No orders of the defined types were placed in this encounter.   AUTHORIZATION INFORMATION Primary Insurance: 1D#: Group #:  Secondary Insurance: 1D#: Group #:  SCHEDULE INFORMATION: Date: 03/20/23 Time: Location: [facility]

## 2023-02-16 DIAGNOSIS — B338 Other specified viral diseases: Secondary | ICD-10-CM

## 2023-02-16 HISTORY — DX: Other specified viral diseases: B33.8

## 2023-02-21 ENCOUNTER — Emergency Department
Admission: EM | Admit: 2023-02-21 | Discharge: 2023-02-21 | Discharge status: Against Medical Advice | Payer: Medicare Other | Attending: Emergency Medicine | Admitting: Emergency Medicine

## 2023-02-21 ENCOUNTER — Emergency Department: Payer: Medicare Other

## 2023-02-21 ENCOUNTER — Other Ambulatory Visit: Payer: Self-pay

## 2023-02-21 ENCOUNTER — Encounter: Payer: Self-pay | Admitting: Emergency Medicine

## 2023-02-21 DIAGNOSIS — R059 Cough, unspecified: Secondary | ICD-10-CM | POA: Insufficient documentation

## 2023-02-21 DIAGNOSIS — R093 Abnormal sputum: Secondary | ICD-10-CM | POA: Insufficient documentation

## 2023-02-21 DIAGNOSIS — R0981 Nasal congestion: Secondary | ICD-10-CM | POA: Insufficient documentation

## 2023-02-21 DIAGNOSIS — B974 Respiratory syncytial virus as the cause of diseases classified elsewhere: Secondary | ICD-10-CM | POA: Diagnosis not present

## 2023-02-21 DIAGNOSIS — M7918 Myalgia, other site: Secondary | ICD-10-CM | POA: Diagnosis not present

## 2023-02-21 DIAGNOSIS — Z5321 Procedure and treatment not carried out due to patient leaving prior to being seen by health care provider: Secondary | ICD-10-CM | POA: Insufficient documentation

## 2023-02-21 DIAGNOSIS — Z20822 Contact with and (suspected) exposure to covid-19: Secondary | ICD-10-CM | POA: Insufficient documentation

## 2023-02-21 LAB — RESP PANEL BY RT-PCR (RSV, FLU A&B, COVID)  RVPGX2
Influenza A by PCR: NEGATIVE
Influenza B by PCR: NEGATIVE
Resp Syncytial Virus by PCR: POSITIVE — AB
SARS Coronavirus 2 by RT PCR: NEGATIVE

## 2023-02-21 NOTE — ED Triage Notes (Signed)
Patient ambulatory to triage with steady gait, without difficulty or distress noted; pt reports for last few days having prod cough green sputum, body aches and congestion; recent exposure to grandson with same

## 2023-02-21 NOTE — ED Notes (Signed)
Patient to desk stating that she is leaving "because I don't feel good and this is taking to long." Pt encouraged to stay for result of swab and provider evaluation. Pt declined. Pt seen ambulating from department with independent and steady gait. Breathing unlabored and speaking in full sentences.

## 2023-03-08 ENCOUNTER — Telehealth: Payer: Self-pay | Admitting: Gastroenterology

## 2023-03-08 NOTE — Telephone Encounter (Signed)
 Dre from [facility] called in to confirm patient referral and double check if patient was scheduled.

## 2023-03-13 ENCOUNTER — Encounter: Payer: Self-pay | Admitting: Gastroenterology

## 2023-03-14 NOTE — Anesthesia Preprocedure Evaluation (Addendum)
 Anesthesia Evaluation  Patient identified by MRN, date of birth, ID band Patient awake    Reviewed: Allergy & Precautions, H&P , NPO status , Patient's Chart, lab work & pertinent test results  History of Anesthesia Complications Negative for: history of anesthetic complications  Airway Mallampati: III  TM Distance: >3 FB Neck ROM: full    Dental  (+) Poor Dentition, Chipped, Missing, Caps   Pulmonary shortness of breath, asthma , former smoker          Cardiovascular Exercise Tolerance: Good hypertension, (-) angina (-) Past MI and (-) DOE      Neuro/Psych  PSYCHIATRIC DISORDERS Anxiety Depression    negative neurological ROS     GI/Hepatic Neg liver ROS,GERD  Medicated and Controlled,,  Endo/Other  diabetes, Type 2, Insulin  Dependent    Renal/GU      Musculoskeletal   Abdominal   Peds  Hematology negative hematology ROS (+)   Anesthesia Other Findings Back injury  ADHD (attention deficit hyperactivity disorder) PTSD (post-traumatic stress disorder)Chronic pain disorder Hypertension  Diabetes mellitus without complication  Depression  Anxiety Asthma  Binge eating disorder GERD (gastroesophageal reflux disease) Dyspnea Arthritis  Motion sickness RSV (respiratory syncytial virus infection) Wears dentures    Reproductive/Obstetrics negative OB ROS                             Anesthesia Physical Anesthesia Plan  ASA: 2  Anesthesia Plan: General   Post-op Pain Management: Minimal or no pain anticipated   Induction: Intravenous  PONV Risk Score and Plan: 3 and Propofol  infusion, TIVA and Ondansetron   Airway Management Planned: Nasal Cannula  Additional Equipment: None  Intra-op Plan:   Post-operative Plan:   Informed Consent: I have reviewed the patients History and Physical, chart, labs and discussed the procedure including the risks, benefits and alternatives for  the proposed anesthesia with the patient or authorized representative who has indicated his/her understanding and acceptance.     Dental advisory given  Plan Discussed with: CRNA and Surgeon  Anesthesia Plan Comments: (Discussed risks of anesthesia with patient, including possibility of difficulty with spontaneous ventilation under anesthesia necessitating airway intervention, PONV, and rare risks such as cardiac or respiratory or neurological events, and allergic reactions. Discussed the role of CRNA in patient's perioperative care. Patient understands.)       Anesthesia Quick Evaluation

## 2023-03-20 ENCOUNTER — Ambulatory Visit
Admission: RE | Admit: 2023-03-20 | Discharge: 2023-03-20 | Disposition: A | Discharge status: Home / Self Care | Payer: Medicare Other | Attending: Gastroenterology | Admitting: Gastroenterology

## 2023-03-20 ENCOUNTER — Encounter
Admission: RE | Disposition: A | Discharge status: Home / Self Care | Payer: Self-pay | Source: Home / Self Care | Attending: Gastroenterology

## 2023-03-20 ENCOUNTER — Ambulatory Visit: Payer: Medicare Other | Admitting: Anesthesiology

## 2023-03-20 ENCOUNTER — Other Ambulatory Visit: Payer: Self-pay

## 2023-03-20 ENCOUNTER — Encounter: Payer: Self-pay | Admitting: Gastroenterology

## 2023-03-20 DIAGNOSIS — K219 Gastro-esophageal reflux disease without esophagitis: Secondary | ICD-10-CM | POA: Insufficient documentation

## 2023-03-20 DIAGNOSIS — E119 Type 2 diabetes mellitus without complications: Secondary | ICD-10-CM | POA: Insufficient documentation

## 2023-03-20 DIAGNOSIS — F32A Depression, unspecified: Secondary | ICD-10-CM | POA: Diagnosis not present

## 2023-03-20 DIAGNOSIS — F419 Anxiety disorder, unspecified: Secondary | ICD-10-CM | POA: Diagnosis not present

## 2023-03-20 DIAGNOSIS — I10 Essential (primary) hypertension: Secondary | ICD-10-CM | POA: Diagnosis not present

## 2023-03-20 DIAGNOSIS — Z7984 Long term (current) use of oral hypoglycemic drugs: Secondary | ICD-10-CM | POA: Diagnosis not present

## 2023-03-20 DIAGNOSIS — Z7985 Long-term (current) use of injectable non-insulin antidiabetic drugs: Secondary | ICD-10-CM | POA: Diagnosis not present

## 2023-03-20 DIAGNOSIS — Z87891 Personal history of nicotine dependence: Secondary | ICD-10-CM | POA: Diagnosis not present

## 2023-03-20 DIAGNOSIS — Z1211 Encounter for screening for malignant neoplasm of colon: Secondary | ICD-10-CM

## 2023-03-20 HISTORY — DX: Motion sickness, initial encounter: T75.3XXA

## 2023-03-20 HISTORY — PX: COLONOSCOPY WITH PROPOFOL: SHX5780

## 2023-03-20 HISTORY — DX: Presence of dental prosthetic device (complete) (partial): Z97.2

## 2023-03-20 LAB — GLUCOSE, CAPILLARY: Glucose-Capillary: 132 mg/dL — ABNORMAL HIGH (ref 70–99)

## 2023-03-20 SURGERY — COLONOSCOPY WITH PROPOFOL
Anesthesia: General | Site: Rectum

## 2023-03-20 MED ORDER — STERILE WATER FOR IRRIGATION IR SOLN
Status: DC | PRN
  Administered 2023-03-20: .05 mL

## 2023-03-20 MED ORDER — LIDOCAINE HCL (CARDIAC) PF 100 MG/5ML IV SOSY
PREFILLED_SYRINGE | INTRAVENOUS | Status: DC | PRN
  Administered 2023-03-20: 30 mg via INTRATRACHEAL

## 2023-03-20 MED ORDER — SODIUM CHLORIDE 0.9 % IV SOLN: INTRAVENOUS | Status: DC

## 2023-03-20 MED ORDER — PROPOFOL 10 MG/ML IV BOLUS
INTRAVENOUS | Status: DC | PRN
  Administered 2023-03-20: 20 mg via INTRAVENOUS
  Administered 2023-03-20: 40 mg via INTRAVENOUS
  Administered 2023-03-20: 50 mg via INTRAVENOUS
  Administered 2023-03-20: 20 mg via INTRAVENOUS
  Administered 2023-03-20: 30 mg via INTRAVENOUS
  Administered 2023-03-20 (×2): 20 mg via INTRAVENOUS

## 2023-03-20 MED ORDER — PROPOFOL 1000 MG/100ML IV EMUL
INTRAVENOUS | Status: AC
  Filled 2023-03-20: qty 100

## 2023-03-20 MED ORDER — STERILE WATER FOR IRRIGATION IR SOLN
Status: DC | PRN
  Administered 2023-03-20: 50 mL

## 2023-03-20 SURGICAL SUPPLY — 4 items
GOWN CVR UNV OPN BCK APRN NK (MISCELLANEOUS) ×2 IMPLANT
KIT PRC NS LF DISP ENDO (KITS) ×1 IMPLANT
MANIFOLD NEPTUNE II (INSTRUMENTS) ×1 IMPLANT
WATER STERILE IRR 250ML POUR (IV SOLUTION) ×1 IMPLANT

## 2023-03-20 NOTE — Op Note (Signed)
 [facility] Gastroenterology Patient Name: Maria Conway Procedure Date: 03/20/2023 8:09 AM MRN: [id number] Account #: [id number] Date of Birth: 03-20-1955 Admit Type: Outpatient Age: 68 Room: [facility] OR ROOM 01 Gender: Female Note Status: Finalized Instrument Name: [id number] Procedure:             Colonoscopy Indications:           Screening for colorectal malignant neoplasm Providers:             Corinn Jess Brooklyn MD, MD Medicines:             General Anesthesia Complications:         No immediate complications. Estimated blood loss: None. Procedure:             Pre-Anesthesia Assessment:                        - Prior to the procedure, a History and Physical was                         performed, and patient medications and allergies were                         reviewed. The patient is competent. The risks and                         benefits of the procedure and the sedation options and                         risks were discussed with the patient. All questions                         were answered and informed consent was obtained.                         Patient identification and proposed procedure were                         verified by the physician, the nurse, the                         anesthesiologist, the anesthetist and the technician                         in the pre-procedure area in the procedure room in the                         endoscopy suite. Mental Status Examination: alert and                         oriented. Airway Examination: normal oropharyngeal                         airway and neck mobility. Respiratory Examination:                         clear to auscultation. CV Examination: normal.                         Prophylactic Antibiotics: The patient  does not require                         prophylactic antibiotics. Prior Anticoagulants: The                         patient has taken no anticoagulant or antiplatelet                          agents. ASA Grade Assessment: II - A patient with mild                         systemic disease. After reviewing the risks and                         benefits, the patient was deemed in satisfactory                         condition to undergo the procedure. The anesthesia                         plan was to use general anesthesia. Immediately prior                         to administration of medications, the patient was                         re-assessed for adequacy to receive sedatives. The                         heart rate, respiratory rate, oxygen  saturations,                         blood pressure, adequacy of pulmonary ventilation, and                         response to care were monitored throughout the                         procedure. The physical status of the patient was                         re-assessed after the procedure.                        After obtaining informed consent, the colonoscope was                         passed under direct vision. Throughout the procedure,                         the patient's blood pressure, pulse, and oxygen                          saturations were monitored continuously. The                         Colonoscope was introduced through the anus and  advanced to the the cecum, identified by appendiceal                         orifice and ileocecal valve. The colonoscopy was                         performed without difficulty. The patient tolerated                         the procedure well. The quality of the bowel                         preparation was evaluated using the BBPS [facility] Bowel                         Preparation Scale) with scores of: Right Colon = 3,                         Transverse Colon = 3 and Left Colon = 3 (entire mucosa                         seen well with no residual staining, small fragments                         of stool or opaque liquid). The total BBPS score                          equals 9. The ileocecal valve, appendiceal orifice,                         and rectum were photographed. Findings:      The perianal and digital rectal examinations were normal. Pertinent       negatives include normal sphincter tone and no palpable rectal lesions.      The entire examined colon appeared normal.      The retroflexed view of the distal rectum and anal verge was normal and       showed no anal or rectal abnormalities. Impression:            - The entire examined colon is normal.                        - The distal rectum and anal verge are normal on                         retroflexion view.                        - No specimens collected. Recommendation:        - Discharge patient to home (with escort).                        - Resume previous diet today.                        - Continue present medications.                        - Repeat colonoscopy in  10 years for screening                         purposes. Procedure Code(s):     --- Professional ---                        H9878, Colorectal cancer screening; colonoscopy on                         individual not meeting criteria for high risk Diagnosis Code(s):     --- Professional ---                        Z12.11, Encounter for screening for malignant neoplasm                         of colon CPT copyright 2022 American Medical Association. All rights reserved. The codes documented in this report are preliminary and upon coder review may  be revised to meet current compliance requirements. Dr. Angelita Brooklyn Corinn Jess Brooklyn MD, MD 03/20/2023 8:36:06 AM This report has been signed electronically. Number of Addenda: 0 Note Initiated On: 03/20/2023 8:09 AM Scope Withdrawal Time: 0 hours 8 minutes 26 seconds  Total Procedure Duration: 0 hours 13 minutes 18 seconds  Estimated Blood Loss:  Estimated blood loss: none.      [facility]

## 2023-03-20 NOTE — Anesthesia Postprocedure Evaluation (Signed)
 Anesthesia Post Note  Patient: Britney R Buescher  Procedure(s) Performed: COLONOSCOPY WITH PROPOFOL  (Rectum)  Patient location during evaluation: PACU Anesthesia Type: General Level of consciousness: awake and alert Pain management: pain level controlled Vital Signs Assessment: post-procedure vital signs reviewed and stable Respiratory status: spontaneous breathing, nonlabored ventilation, respiratory function stable and patient connected to nasal cannula oxygen  Cardiovascular status: blood pressure returned to baseline and stable Postop Assessment: no apparent nausea or vomiting Anesthetic complications: no  No notable events documented.   Last Vitals:  Vitals:   03/20/23 0735  BP: 137/79  Pulse: 78  Temp: (!) 36.4 C  SpO2: 98%    Last Pain:  Vitals:   03/20/23 0735  PainSc: 0-No pain                 Debby Mines

## 2023-03-20 NOTE — Transfer of Care (Signed)
 Immediate Anesthesia Transfer of Care Note  Patient: Maria Conway  Procedure(s) Performed: COLONOSCOPY WITH PROPOFOL  (Rectum)  Patient Location: PACU  Anesthesia Type: General  Level of Consciousness: awake, alert  and patient cooperative  Airway and Oxygen  Therapy: Patient Spontanous Breathing and Patient connected to supplemental oxygen   Post-op Assessment: Post-op Vital signs reviewed, Patient's Cardiovascular Status Stable, Respiratory Function Stable, Patent Airway and No signs of Nausea or vomiting  Post-op Vital Signs: Reviewed and stable  Complications: No notable events documented.
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
## 2023-03-21 ENCOUNTER — Encounter: Payer: Self-pay | Admitting: Gastroenterology

## 2023-05-01 ENCOUNTER — Other Ambulatory Visit: Payer: Self-pay | Admitting: Family Medicine

## 2023-05-01 DIAGNOSIS — Z1231 Encounter for screening mammogram for malignant neoplasm of breast: Secondary | ICD-10-CM
